# Patient Record
Sex: Female | Born: 1950 | Race: Black or African American | Hispanic: No | Marital: Married | Smoking: Never smoker
Health system: Southern US, Community
[De-identification: ages and names within clinical notes are randomized; demographics above are authoritative.]

## PROBLEM LIST (undated history)

## (undated) DIAGNOSIS — H269 Unspecified cataract: Secondary | ICD-10-CM

## (undated) DIAGNOSIS — I10 Essential (primary) hypertension: Secondary | ICD-10-CM

## (undated) DIAGNOSIS — T7840XA Allergy, unspecified, initial encounter: Secondary | ICD-10-CM

## (undated) HISTORY — PX: COLONOSCOPY: SHX174

## (undated) HISTORY — DX: Unspecified cataract: H26.9

## (undated) HISTORY — DX: Essential (primary) hypertension: I10

## (undated) HISTORY — DX: Allergy, unspecified, initial encounter: T78.40XA

## (undated) HISTORY — PX: PARTIAL HYSTERECTOMY: SHX80

## (undated) HISTORY — PX: HERNIA REPAIR: SHX51

---

## 1999-04-04 ENCOUNTER — Other Ambulatory Visit: Admission: RE | Admit: 1999-04-04 | Discharge: 1999-04-04 | Payer: Self-pay | Admitting: Obstetrics

## 1999-04-04 ENCOUNTER — Encounter (INDEPENDENT_AMBULATORY_CARE_PROVIDER_SITE_OTHER): Payer: Self-pay

## 1999-04-08 ENCOUNTER — Encounter: Payer: Self-pay | Admitting: Obstetrics

## 1999-04-08 ENCOUNTER — Ambulatory Visit (HOSPITAL_COMMUNITY): Admission: RE | Admit: 1999-04-08 | Discharge: 1999-04-08 | Payer: Self-pay | Admitting: Obstetrics

## 1999-05-03 ENCOUNTER — Ambulatory Visit (HOSPITAL_COMMUNITY): Admission: RE | Admit: 1999-05-03 | Discharge: 1999-05-03 | Payer: Self-pay | Admitting: Obstetrics

## 1999-05-03 ENCOUNTER — Encounter (INDEPENDENT_AMBULATORY_CARE_PROVIDER_SITE_OTHER): Payer: Self-pay | Admitting: Specialist

## 2001-01-25 ENCOUNTER — Emergency Department (HOSPITAL_COMMUNITY): Admission: EM | Admit: 2001-01-25 | Discharge: 2001-01-25 | Payer: Self-pay

## 2002-02-26 ENCOUNTER — Encounter (INDEPENDENT_AMBULATORY_CARE_PROVIDER_SITE_OTHER): Payer: Self-pay

## 2002-02-26 ENCOUNTER — Inpatient Hospital Stay (HOSPITAL_COMMUNITY): Admission: RE | Admit: 2002-02-26 | Discharge: 2002-03-01 | Payer: Self-pay | Admitting: Obstetrics

## 2003-12-21 ENCOUNTER — Emergency Department (HOSPITAL_COMMUNITY): Admission: EM | Admit: 2003-12-21 | Discharge: 2003-12-21 | Payer: Self-pay | Admitting: Emergency Medicine

## 2004-05-12 ENCOUNTER — Emergency Department (HOSPITAL_COMMUNITY): Admission: EM | Admit: 2004-05-12 | Discharge: 2004-05-12 | Payer: Self-pay | Admitting: Emergency Medicine

## 2004-07-29 ENCOUNTER — Emergency Department (HOSPITAL_COMMUNITY): Admission: EM | Admit: 2004-07-29 | Discharge: 2004-07-29 | Payer: Self-pay | Admitting: Emergency Medicine

## 2004-09-20 ENCOUNTER — Ambulatory Visit: Payer: Self-pay | Admitting: Internal Medicine

## 2004-10-06 ENCOUNTER — Ambulatory Visit: Payer: Self-pay | Admitting: Internal Medicine

## 2004-10-17 ENCOUNTER — Ambulatory Visit: Payer: Self-pay | Admitting: Internal Medicine

## 2006-04-10 ENCOUNTER — Ambulatory Visit: Payer: Self-pay | Admitting: Internal Medicine

## 2006-04-27 ENCOUNTER — Ambulatory Visit: Payer: Self-pay | Admitting: Internal Medicine

## 2006-04-27 ENCOUNTER — Ambulatory Visit (HOSPITAL_COMMUNITY): Admission: RE | Admit: 2006-04-27 | Discharge: 2006-04-27 | Payer: Self-pay | Admitting: Internal Medicine

## 2007-01-16 ENCOUNTER — Emergency Department (HOSPITAL_COMMUNITY): Admission: EM | Admit: 2007-01-16 | Discharge: 2007-01-16 | Payer: Self-pay | Admitting: Emergency Medicine

## 2007-03-19 ENCOUNTER — Ambulatory Visit: Payer: Self-pay | Admitting: Family Medicine

## 2007-03-20 ENCOUNTER — Ambulatory Visit (HOSPITAL_COMMUNITY): Admission: RE | Admit: 2007-03-20 | Discharge: 2007-03-20 | Payer: Self-pay | Admitting: Family Medicine

## 2007-03-20 ENCOUNTER — Ambulatory Visit: Payer: Self-pay | Admitting: *Deleted

## 2007-04-23 ENCOUNTER — Ambulatory Visit: Payer: Self-pay | Admitting: Family Medicine

## 2007-07-08 ENCOUNTER — Ambulatory Visit: Payer: Self-pay | Admitting: Family Medicine

## 2007-07-10 ENCOUNTER — Ambulatory Visit: Payer: Self-pay | Admitting: Family Medicine

## 2007-10-10 ENCOUNTER — Ambulatory Visit: Payer: Self-pay | Admitting: Family Medicine

## 2007-10-17 ENCOUNTER — Ambulatory Visit (HOSPITAL_COMMUNITY): Admission: RE | Admit: 2007-10-17 | Discharge: 2007-10-17 | Payer: Self-pay | Admitting: Family Medicine

## 2008-04-30 ENCOUNTER — Ambulatory Visit: Payer: Self-pay | Admitting: Internal Medicine

## 2008-05-11 ENCOUNTER — Ambulatory Visit: Payer: Self-pay | Admitting: Internal Medicine

## 2008-05-11 ENCOUNTER — Encounter: Payer: Self-pay | Admitting: Internal Medicine

## 2008-05-13 ENCOUNTER — Encounter: Payer: Self-pay | Admitting: Internal Medicine

## 2008-08-06 ENCOUNTER — Ambulatory Visit: Payer: Self-pay | Admitting: Family Medicine

## 2008-09-15 ENCOUNTER — Ambulatory Visit: Payer: Self-pay | Admitting: Family Medicine

## 2008-09-15 LAB — CONVERTED CEMR LAB
Albumin: 4.1 g/dL (ref 3.5–5.2)
Alkaline Phosphatase: 99 units/L (ref 39–117)
BUN: 16 mg/dL (ref 6–23)
CO2: 26 meq/L (ref 19–32)
Calcium: 9.3 mg/dL (ref 8.4–10.5)
Chloride: 106 meq/L (ref 96–112)
Cholesterol: 213 mg/dL — ABNORMAL HIGH (ref 0–200)
Eosinophils Absolute: 0.3 10*3/uL (ref 0.0–0.7)
Glucose, Bld: 96 mg/dL (ref 70–99)
HDL: 40 mg/dL (ref >39)
Lymphocytes Relative: 44 % (ref 12–46)
Lymphs Abs: 3.7 10*3/uL (ref 0.7–4.0)
MCV: 88.5 fL (ref 78.0–100.0)
Monocytes Relative: 10 % (ref 3–12)
Neutrophils Relative %: 42 % — ABNORMAL LOW (ref 43–77)
Potassium: 4 meq/L (ref 3.5–5.3)
RBC: 5.05 M/uL (ref 3.87–5.11)
Sodium: 143 meq/L (ref 135–145)
Total Protein: 7.5 g/dL (ref 6.0–8.3)
Triglycerides: 227 mg/dL — ABNORMAL HIGH (ref <150)
Vit D, 1,25-Dihydroxy: 21 — ABNORMAL LOW (ref 30–89)
WBC: 8.5 10*3/uL (ref 4.0–10.5)

## 2009-01-25 ENCOUNTER — Ambulatory Visit: Payer: Self-pay | Admitting: Internal Medicine

## 2009-03-29 ENCOUNTER — Ambulatory Visit: Payer: Self-pay | Admitting: Family Medicine

## 2009-06-29 ENCOUNTER — Ambulatory Visit: Payer: Self-pay | Admitting: Family Medicine

## 2009-06-29 LAB — CONVERTED CEMR LAB
CO2: 24 meq/L (ref 19–32)
Chloride: 101 meq/L (ref 96–112)
Creatinine, Ser: 0.88 mg/dL (ref 0.40–1.20)
HDL: 39 mg/dL — ABNORMAL LOW (ref >39)
LDL Cholesterol: 123 mg/dL — ABNORMAL HIGH (ref 0–99)
Potassium: 3.6 meq/L (ref 3.5–5.3)
Sodium: 141 meq/L (ref 135–145)
Total CHOL/HDL Ratio: 5
Triglycerides: 171 mg/dL — ABNORMAL HIGH (ref <150)
Vit D, 25-Hydroxy: 26 ng/mL — ABNORMAL LOW (ref 30–89)

## 2010-03-03 ENCOUNTER — Ambulatory Visit: Payer: Self-pay | Admitting: Family Medicine

## 2010-03-03 LAB — CONVERTED CEMR LAB
ALT: 17 units/L (ref 0–35)
AST: 27 units/L (ref 0–37)
BUN: 16 mg/dL (ref 6–23)
Calcium: 8.8 mg/dL (ref 8.4–10.5)
Chloride: 103 meq/L (ref 96–112)
Creatinine, Ser: 0.92 mg/dL (ref 0.40–1.20)
TSH: 4.138 microintl units/mL (ref 0.350–4.500)
Total Bilirubin: 0.5 mg/dL (ref 0.3–1.2)
Vit D, 25-Hydroxy: 45 ng/mL (ref 30–89)

## 2011-02-03 NOTE — Op Note (Signed)
[facility]  Patient:    Beth Stone, Beth Stone Visit Number: [id number] MRN: [id number]          Service Type: GYN Location: 9300 [id number] Attending Physician:  Venita Sheffield Dictated by:   Kathreen Cosier, M.D. Proc. Date: 02/26/02 Admit Date:  02/26/2002 Discharge Date: 03/01/2002                             Operative Report  PREOPERATIVE DIAGNOSIS:       Myoma uteri.  POSTOPERATIVE DIAGNOSIS:  OPERATION:  SURGEON:                      Kathreen Cosier, M.D.  ASSISTANT:                    Titus Dubin. Alwyn Ren, M.D.  ANESTHESIA:  ESTIMATED BLOOD LOSS:  DESCRIPTION OF PROCEDURE:     The patient is placed on the operating table in the supine position. General anesthesia was administered.  The abdomen was prepped and draped and the bladder emptied with a Foley catheter.  A transverse suprapubic incision made and carried down to the rectus fascia. The fascia cleaned and incised the length of the incision.  The rectus muscles retracted laterally.  The peritoneum was incised longitudinally.  She had 18 weeks size myomas and the uterus was delivered.  The right round ligament was grasped with Kelly clamp, cut, and suture ligated with #1 chromic.  The procedure was done in the similar fashion on the other side.  Using the Metzenbaum scissors, bladder flap was developed and the bladder pushed down off the cervix.  The right infundibulopelvic ligament grasped with a Kelly clamp, cut, and suture ligated with #1 chromic.  The procedure was done in a similar fashion on the other side.  The uterine vessels were skeletonized bilaterally, doubly clamped on the right with Heaney clamps, cut, and suture ligated with #1 chromic.  The procedure was done in a similar fashion on the other side.  The specimen consisting of the uterus, tubes, and ovaries were removed at the cervicovaginal junction with Mayo scissors.  Modified Richardson sutures  placed in the angles of the vagina with #1 chromic. Hemostasis was satisfactory.  The operative site was reperitonealized with 2-0 chromic.  Lap and sponge counts were correct.  The abdomen was closed in layers. The peritoneum with continuous suture of 0 chromic, the fascia with continuous suture of 0 Dexon, skin closed with subcuticular stitch of 3-0 plain.  Estimated blood loss 100 to 125 cc.  The patient tolerated the procedure well and was taken to the recovery room in good condition. Dictated by:   Kathreen Cosier, M.D. Attending Physician:  Venita Sheffield DD:  02/26/02 TD:  02/28/02 Job: [id number]

## 2011-02-03 NOTE — Discharge Summary (Signed)
[facility]  Patient:    Beth Stone, Beth Stone Visit Number: [id number] MRN: [id number]          Service Type: GYN Location: 9300 [id number] Attending Physician:  Venita Sheffield Dictated by:   Kathreen Cosier, M.D. Admit Date:  02/26/2002 Discharge Date: 03/01/2002                             Discharge Summary  HISTORY/HOSPITAL COURSE:  The patient is a 60 year old female with large myomas was admitted for TAHBSO. On admission, the hemoglobin was 13.9, platelets 283, white count 7.2.  The PT and PTT were normal.  Sodium 140, potassium 4, chloride 103.  The patient underwent a TAH and BSO. Postoperatively, she did well.  Her hemoglobin, postoperatively, was 12.6. She was discharged on the third postoperative day, ambulatory on a regular diet to see me in four weeks.  DISCHARGE DIAGNOSES:          Status post total abdominal hysterectomy, bilateral salpingo-oophorectomy for myoma uteri. Dictated by:   Kathreen Cosier, M.D. Attending Physician:  Venita Sheffield DD:  03/01/02 TD:  03/03/02 Job: [id number]

## 2011-02-04 ENCOUNTER — Emergency Department (HOSPITAL_COMMUNITY)
Admission: EM | Admit: 2011-02-04 | Discharge: 2011-02-05 | Disposition: A | Discharge status: Home / Self Care | Payer: Medicare Other | Attending: Emergency Medicine | Admitting: Emergency Medicine

## 2011-02-04 DIAGNOSIS — R1012 Left upper quadrant pain: Secondary | ICD-10-CM | POA: Insufficient documentation

## 2011-02-04 DIAGNOSIS — I10 Essential (primary) hypertension: Secondary | ICD-10-CM | POA: Insufficient documentation

## 2011-02-05 ENCOUNTER — Emergency Department (HOSPITAL_COMMUNITY): Payer: Medicare Other

## 2013-05-13 ENCOUNTER — Encounter: Payer: Self-pay | Admitting: Internal Medicine

## 2013-07-09 ENCOUNTER — Other Ambulatory Visit: Payer: Self-pay

## 2013-07-10 ENCOUNTER — Other Ambulatory Visit: Payer: Self-pay | Admitting: *Deleted

## 2013-07-11 ENCOUNTER — Other Ambulatory Visit: Payer: Self-pay | Admitting: *Deleted

## 2013-07-11 MED ORDER — GABAPENTIN 300 MG PO CAPS: 300.0000 mg | ORAL_CAPSULE | Freq: Every day | ORAL | Status: DC

## 2013-07-11 NOTE — Telephone Encounter (Signed)
Called [facility] [address] [phone], ordered Gabapentin 300 mg #30 1 tablet po q hs +2 refills.  Ordered by Dr Ralene Cork.  Left message [phone].

## 2013-09-25 ENCOUNTER — Encounter: Payer: Self-pay | Admitting: Internal Medicine

## 2013-10-27 ENCOUNTER — Ambulatory Visit (AMBULATORY_SURGERY_CENTER): Payer: Self-pay | Admitting: *Deleted

## 2013-10-27 VITALS — Ht 67.0 in | Wt 197.0 lb

## 2013-10-27 DIAGNOSIS — Z86010 Personal history of colonic polyps: Secondary | ICD-10-CM

## 2013-10-27 MED ORDER — MOVIPREP 100 G PO SOLR: ORAL | Status: DC

## 2013-10-27 NOTE — Progress Notes (Signed)
Patient denies any allergies to eggs or soy. Patient denies any problems with anesthesia.  

## 2013-11-03 ENCOUNTER — Telehealth: Payer: Self-pay | Admitting: Internal Medicine

## 2013-11-03 NOTE — Telephone Encounter (Signed)
Spoke to patient and told her there was not an OTC alternative to Moviprep.  Patient understood and stated she would go ahead and get it.

## 2013-11-10 ENCOUNTER — Encounter: Payer: Medicare Other | Admitting: Internal Medicine

## 2013-11-27 ENCOUNTER — Encounter: Payer: Self-pay | Admitting: Internal Medicine

## 2013-11-27 ENCOUNTER — Ambulatory Visit (AMBULATORY_SURGERY_CENTER): Payer: Medicare Other | Admitting: Internal Medicine

## 2013-11-27 VITALS — BP 108/80 | HR 80 | Temp 96.4°F | Resp 17 | Ht 67.0 in | Wt 197.0 lb

## 2013-11-27 DIAGNOSIS — I10 Essential (primary) hypertension: Secondary | ICD-10-CM | POA: Diagnosis not present

## 2013-11-27 DIAGNOSIS — D126 Benign neoplasm of colon, unspecified: Secondary | ICD-10-CM | POA: Diagnosis not present

## 2013-11-27 DIAGNOSIS — Z86010 Personal history of colonic polyps: Secondary | ICD-10-CM | POA: Diagnosis not present

## 2013-11-27 DIAGNOSIS — E669 Obesity, unspecified: Secondary | ICD-10-CM | POA: Diagnosis not present

## 2013-11-27 DIAGNOSIS — Z1211 Encounter for screening for malignant neoplasm of colon: Secondary | ICD-10-CM | POA: Diagnosis not present

## 2013-11-27 MED ORDER — SODIUM CHLORIDE 0.9 % IV SOLN: 500.0000 mL | INTRAVENOUS | Status: DC

## 2013-11-27 NOTE — Progress Notes (Signed)
Called to room to assist during endoscopic procedure.  Patient ID and intended procedure confirmed with present staff. Received instructions for my participation in the procedure from the performing physician.  

## 2013-11-27 NOTE — Progress Notes (Signed)
Procedure ends, to recovery, report given and VSS. 

## 2013-11-27 NOTE — Op Note (Signed)
[place]  [address] [place], [address]   COLONOSCOPY PROCEDURE REPORT  PATIENT: Beth Stone, Beth Stone.  MR#: [id number] BIRTHDATE: [birth year] , 62  yrs. old GENDER: Female ENDOSCOPIST: Eustace Quail, MD REFERRED [id number] Program Recall PROCEDURE DATE:  11/27/2013 PROCEDURE:   Colonoscopy with snare polypectomy x 3 First Screening Colonoscopy - Avg.  risk and is 50 yrs.  old or older - No.  Prior Negative Screening - Now for repeat screening. N/A  History of Adenoma - Now for follow-up colonoscopy & has been > or = to 3 yrs.  Yes hx of adenoma.  Has been 3 or more years since last colonoscopy.  Polyps Removed Today? Yes. ASA CLASS:   Class II INDICATIONS:Patient's personal history of adenomatous colon polyps. Index 2006 (small TA); 2009 (small TA) MEDICATIONS: MAC sedation, administered by CRNA and propofol (Diprivan) 200mg  IV  DESCRIPTION OF PROCEDURE:   After the risks benefits and alternatives of the procedure were thoroughly explained, informed consent was obtained.  A digital rectal exam revealed no abnormalities of the rectum.   The LB [id number]  endoscope was introduced through the anus and advanced to the cecum, which was identified by both the appendix and ileocecal valve. No adverse events experienced.   The quality of the prep was excellent, using MoviPrep  The instrument was then slowly withdrawn as the colon was fully examined.  COLON FINDINGS: Three diminutive polyps were found in the transverse and descending colon.  A polypectomy was performed with a cold snare.  The resection was complete and the polyp tissue was completely retrieved.   Moderate diverticulosis was noted in the ascending  and sigmoid colon.   The colon mucosa was otherwise normal.  Retroflexed views revealed internal hemorrhoids. The time to cecum=2 minutes 57 seconds.  Withdrawal time=2 minutes 57 seconds.  The scope was withdrawn and the procedure  completed. COMPLICATIONS: There were no complications.  ENDOSCOPIC IMPRESSION: 1.   Three diminutive polyps were found in the colon; polypectomy was performed with a cold snare 2.   Moderate diverticulosis was noted in the ascending colon and sigmoid colon 3.   The colon mucosa was otherwise normal  RECOMMENDATIONS: 1. Follow up colonoscopy in 5 years   eSigned:  Eustace Quail, MD 11/27/2013 10:04 AM   cc: The Patient and Fernande Boyden MD    ([place])

## 2013-11-27 NOTE — Patient Instructions (Signed)

## 2013-11-28 ENCOUNTER — Telehealth: Payer: Self-pay | Admitting: *Deleted

## 2013-11-28 NOTE — Telephone Encounter (Signed)
Left message that we called for f/u 

## 2013-12-02 ENCOUNTER — Encounter: Payer: Self-pay | Admitting: Internal Medicine

## 2013-12-29 DIAGNOSIS — G589 Mononeuropathy, unspecified: Secondary | ICD-10-CM | POA: Diagnosis not present

## 2013-12-29 DIAGNOSIS — I10 Essential (primary) hypertension: Secondary | ICD-10-CM | POA: Diagnosis not present

## 2013-12-29 DIAGNOSIS — M129 Arthropathy, unspecified: Secondary | ICD-10-CM | POA: Diagnosis not present

## 2014-01-02 DIAGNOSIS — R002 Palpitations: Secondary | ICD-10-CM | POA: Diagnosis not present

## 2014-01-02 DIAGNOSIS — R51 Headache: Secondary | ICD-10-CM | POA: Diagnosis not present

## 2014-02-10 ENCOUNTER — Other Ambulatory Visit: Payer: Self-pay

## 2014-02-10 ENCOUNTER — Ambulatory Visit (INDEPENDENT_AMBULATORY_CARE_PROVIDER_SITE_OTHER): Payer: Medicare Other

## 2014-02-10 VITALS — BP 132/98 | HR 75 | Resp 16

## 2014-02-10 DIAGNOSIS — IMO0002 Reserved for concepts with insufficient information to code with codable children: Secondary | ICD-10-CM | POA: Diagnosis not present

## 2014-02-10 DIAGNOSIS — G609 Hereditary and idiopathic neuropathy, unspecified: Secondary | ICD-10-CM

## 2014-02-10 DIAGNOSIS — G629 Polyneuropathy, unspecified: Secondary | ICD-10-CM

## 2014-02-10 DIAGNOSIS — M792 Neuralgia and neuritis, unspecified: Secondary | ICD-10-CM

## 2014-02-10 MED ORDER — GABAPENTIN 300 MG PO CAPS: ORAL_CAPSULE | ORAL | Status: DC

## 2014-02-10 NOTE — Patient Instructions (Signed)

## 2014-02-10 NOTE — Progress Notes (Signed)
   Subjective:    Patient ID: Beth Stone, female    DOB: 11-Jul-1951, 63 y.o.   MRN: [id number]  HPI  Pt reports tingling has had no tingling in feet since she stopped her BP medication. Reports no pain or tingling sensation in feet currently  Review of Systems     Objective:   Physical Exam Neurovascular status is intact pedal pulses are palpable epicritic and proprioceptive sensations intact and symmetric patient case last 2 months since she stopped taking her fluid pill are hydrochlorothiazide she is also lost any the tingling [name] sensations repair paresthesias the patient had previously been experiencing he still taking gabapentin however we'll try discontinue gabapentin for a period of time and may maintain discontinued if symptoms do not recur or if symptoms recur read issued every prescription new prescription with 5 refills for continues to gabapentin  300 mg each bedtime followup in 6 months for an as-needed basis if she continues any problems or difficulties or need any further treatment.       Assessment & Plan:  Assessment is improving resolving peripheral neuropathy symptomology patient is no longer having the paresthesias made the trial discontinue gabapentin patient is no longer taking hydrochlorothiazide and in managing her blood pressure in a different medication continue to follow future as needed we'll get copy of her labs done recently by Dr. Joneen Caraway with [facility]. Recheck in 6 months as needed  Harriet Masson DPM

## 2014-02-11 ENCOUNTER — Other Ambulatory Visit: Payer: Self-pay

## 2014-03-30 DIAGNOSIS — Z1231 Encounter for screening mammogram for malignant neoplasm of breast: Secondary | ICD-10-CM | POA: Diagnosis not present

## 2014-03-30 DIAGNOSIS — Z780 Asymptomatic menopausal state: Secondary | ICD-10-CM | POA: Diagnosis not present

## 2014-06-08 ENCOUNTER — Encounter: Payer: Self-pay | Admitting: Internal Medicine

## 2014-07-13 DIAGNOSIS — I10 Essential (primary) hypertension: Secondary | ICD-10-CM | POA: Diagnosis not present

## 2015-02-17 DIAGNOSIS — Z0000 Encounter for general adult medical examination without abnormal findings: Secondary | ICD-10-CM | POA: Diagnosis not present

## 2015-02-17 DIAGNOSIS — E663 Overweight: Secondary | ICD-10-CM | POA: Diagnosis not present

## 2015-02-17 DIAGNOSIS — Z13220 Encounter for screening for lipoid disorders: Secondary | ICD-10-CM | POA: Diagnosis not present

## 2015-04-05 DIAGNOSIS — Z1231 Encounter for screening mammogram for malignant neoplasm of breast: Secondary | ICD-10-CM | POA: Diagnosis not present

## 2015-12-03 ENCOUNTER — Emergency Department (INDEPENDENT_AMBULATORY_CARE_PROVIDER_SITE_OTHER): Payer: Medicare Other

## 2015-12-03 ENCOUNTER — Emergency Department (HOSPITAL_COMMUNITY): Payer: Medicare Other

## 2015-12-03 ENCOUNTER — Encounter (HOSPITAL_COMMUNITY): Payer: Self-pay | Admitting: *Deleted

## 2015-12-03 ENCOUNTER — Emergency Department (INDEPENDENT_AMBULATORY_CARE_PROVIDER_SITE_OTHER)
Admission: EM | Admit: 2015-12-03 | Discharge: 2015-12-03 | Disposition: A | Discharge status: Home / Self Care | Payer: Medicare Other | Source: Home / Self Care | Attending: Family Medicine | Admitting: Family Medicine

## 2015-12-03 DIAGNOSIS — R0781 Pleurodynia: Secondary | ICD-10-CM

## 2015-12-03 DIAGNOSIS — R2232 Localized swelling, mass and lump, left upper limb: Secondary | ICD-10-CM | POA: Diagnosis not present

## 2015-12-03 NOTE — ED Notes (Signed)
Pt  Noticed  A  Mass  On l  Upper chest    That  She  Noticed        Today         No  Pain   Sitting  Upright  On the  Exam table  In no  Acute   Distress

## 2015-12-03 NOTE — ED Provider Notes (Signed)
CSN: [id number]     Arrival date & time 12/03/15  1338 History   First MD Initiated Contact with Patient 12/03/15 1536     Chief Complaint  Patient presents with  . Mass   (Consider location/radiation/quality/duration/timing/severity/associated sxs/prior Treatment) Patient is a 65 y.o. female presenting with rash. The history is provided by the patient.  Rash Location:  Torso Torso rash location:  L chest Quality: swelling   Severity:  Mild Onset quality:  Sudden Duration:  1 day Progression:  Unchanged Chronicity:  New Context comment:  Just noticed by pt this am when getting dressed, nontender.   Past Medical History  Diagnosis Date  . Hypertension    Past Surgical History  Procedure Laterality Date  . Hernia repair      x2  . Partial hysterectomy     Family History  Problem Relation Age of Onset  . Colon cancer Neg Hx    Social History  Substance Use Topics  . Smoking status: Never Smoker   . Smokeless tobacco: Never Used  . Alcohol Use: No   OB History    No data available     Review of Systems  Constitutional: Negative.   Respiratory: Negative.   Cardiovascular: Negative.   Musculoskeletal: Negative.   Skin: Positive for rash.  All other systems reviewed and are negative.   Allergies  Penicillins  Home Medications   Prior to Admission medications   Medication Sig Start Date End Date Taking? Authorizing Provider  aspirin 81 MG tablet Take 81 mg by mouth daily.    Historical Provider, MD  gabapentin (NEURONTIN) 300 MG capsule TAKE 1 CAPSULE AT BEDTIME 02/10/14   Harriet Masson, DPM  gabapentin (NEURONTIN) 300 MG capsule TAKE 1 CAPSULE AT BEDTIME 02/11/14   Harriet Masson, DPM  hydrochlorothiazide (MICROZIDE) 12.5 MG capsule Take 12.5 mg by mouth daily.    Historical Provider, MD   Meds Ordered and Administered this Visit  Medications - No data to display  BP 143/91 mmHg  Pulse 71  Temp(Src) 98.1 F (36.7 C) (Oral)  Resp 16  SpO2 97% No data  found.   Physical Exam  Constitutional: She is oriented to person, place, and time. She appears well-developed and well-nourished. No distress.  Musculoskeletal: Normal range of motion.  Neurological: She is alert and oriented to person, place, and time.  Skin: Skin is warm and dry.     Nursing note and vitals reviewed.   ED Course  Procedures (including critical care time)  Labs Review Labs Reviewed - No data to display  Imaging Review Dg Clavicle Left  12/03/2015  CLINICAL DATA:  Soft tissue prominence along medial left clavicle EXAM: LEFT CLAVICLE - 2+ VIEWS COMPARISON:  None. FINDINGS: Frontal and tilt frontal images were obtained. Of fracture or dislocation. Joint spaces appear intact. No abnormal periosteal reaction. There is focal soft tissue fullness superior to the midportion left clavicle on the frontal view. No soft tissue calcification or air is seen in this area. This finding is not confirmed on the tilt frontal image. IMPRESSION: Soft tissue fullness on the frontal view superior to the left clavicle midportion, seen on the frontal view but not confirmed on the tilt frontal image. Significance of this soft tissue fullness is uncertain. No bony abnormality is appreciable. If there remains concern for pathology in the supraclavicular region, CT with intravenous contrast could be helpful to further assess. Electronically Signed   By: Lowella Grip III M.D.   On: 12/03/2015 16:20  Visual Acuity Review  Right Eye Distance:   Left Eye Distance:   Bilateral Distance:    Right Eye Near:   Left Eye Near:    Bilateral Near:         MDM   1. Rib pain on left side        Billy Fischer, MD 12/03/15 1648

## 2015-12-03 NOTE — Discharge Instructions (Signed)
Use heat on rib area if needed and see specialist list if further concerns.

## 2015-12-08 DIAGNOSIS — R599 Enlarged lymph nodes, unspecified: Secondary | ICD-10-CM | POA: Diagnosis not present

## 2015-12-08 DIAGNOSIS — E049 Nontoxic goiter, unspecified: Secondary | ICD-10-CM | POA: Diagnosis not present

## 2015-12-16 DIAGNOSIS — R599 Enlarged lymph nodes, unspecified: Secondary | ICD-10-CM | POA: Diagnosis not present

## 2016-01-10 DIAGNOSIS — R221 Localized swelling, mass and lump, neck: Secondary | ICD-10-CM | POA: Diagnosis not present

## 2016-01-10 DIAGNOSIS — E042 Nontoxic multinodular goiter: Secondary | ICD-10-CM | POA: Diagnosis not present

## 2016-02-02 DIAGNOSIS — R591 Generalized enlarged lymph nodes: Secondary | ICD-10-CM | POA: Diagnosis not present

## 2016-02-21 DIAGNOSIS — R591 Generalized enlarged lymph nodes: Secondary | ICD-10-CM | POA: Diagnosis not present

## 2016-04-05 DIAGNOSIS — Z1231 Encounter for screening mammogram for malignant neoplasm of breast: Secondary | ICD-10-CM | POA: Diagnosis not present

## 2016-04-06 DIAGNOSIS — R928 Other abnormal and inconclusive findings on diagnostic imaging of breast: Secondary | ICD-10-CM | POA: Diagnosis not present

## 2016-04-10 DIAGNOSIS — R928 Other abnormal and inconclusive findings on diagnostic imaging of breast: Secondary | ICD-10-CM | POA: Diagnosis not present

## 2016-04-12 DIAGNOSIS — Z1231 Encounter for screening mammogram for malignant neoplasm of breast: Secondary | ICD-10-CM | POA: Diagnosis not present

## 2016-05-05 DIAGNOSIS — E042 Nontoxic multinodular goiter: Secondary | ICD-10-CM | POA: Diagnosis not present

## 2016-05-05 DIAGNOSIS — L659 Nonscarring hair loss, unspecified: Secondary | ICD-10-CM | POA: Diagnosis not present

## 2016-05-05 DIAGNOSIS — Z136 Encounter for screening for cardiovascular disorders: Secondary | ICD-10-CM | POA: Diagnosis not present

## 2016-05-05 DIAGNOSIS — Z13220 Encounter for screening for lipoid disorders: Secondary | ICD-10-CM | POA: Diagnosis not present

## 2016-05-05 DIAGNOSIS — Z0001 Encounter for general adult medical examination with abnormal findings: Secondary | ICD-10-CM | POA: Diagnosis not present

## 2016-05-08 DIAGNOSIS — M8589 Other specified disorders of bone density and structure, multiple sites: Secondary | ICD-10-CM | POA: Diagnosis not present

## 2016-07-07 ENCOUNTER — Encounter: Payer: Self-pay | Admitting: Podiatry

## 2016-07-07 ENCOUNTER — Ambulatory Visit (INDEPENDENT_AMBULATORY_CARE_PROVIDER_SITE_OTHER): Payer: Medicare Other | Admitting: Podiatry

## 2016-07-07 VITALS — BP 140/105 | HR 72 | Resp 14

## 2016-07-07 DIAGNOSIS — L600 Ingrowing nail: Secondary | ICD-10-CM

## 2016-07-07 NOTE — Progress Notes (Signed)
   Subjective:    Patient ID: Beth Stone, female    DOB: 03-15-1951, 65 y.o.   MRN: [id number]  HPI  65 year old female presents the office today for concerns of her right big toenail on the medial border becoming more painful over the last few weeks almost been ongoing for several years. She denies any redness or drainage from the nail site but is painful with pressure in shoe gear. She tried trimming her toenails were any 70 relief. No other complaints.   Review of Systems  All other systems reviewed and are negative.      Objective:   Physical Exam General: AAO x3, NAD  Dermatological: There is incurvation along the medial nail border left hallux toenail tenderness palpation. There is minimal localized edema and erythema or increased warmth. No drainage or pus expressed. No ascending cellulitis. No fluctuance or crepitus or any malodor.  Vascular: Dorsalis Pedis artery and Posterior Tibial artery pedal pulses are 2/4 bilateral with immedate capillary fill time. There is no pain with calf compression, swelling, warmth, erythema.   Neruologic: Grossly intact via light touch bilateral. Vibratory intact via tuning fork bilateral. Protective threshold with Semmes Wienstein monofilament intact to all pedal sites bilateral.   Musculoskeletal: No gross boney pedal deformities bilateral. No pain, crepitus, or limitation noted with foot and ankle range of motion bilateral. Muscular strength 5/5 in all groups tested bilateral.  Gait: Unassisted, Nonantalgic.     Assessment & Plan:  65 year old female left medial nail border ingrown toenail -Treatment options discussed including all alternatives, risks, and complications -Etiology of symptoms were discussed -At this time, the patient is requesting partial nail removal with chemical matricectomy to the symptomatic portion of the nail. Risks and complications were discussed with the patient for which they understand and  verbally consent  to the procedure. Under sterile conditions a total of 3 mL of a mixture of 2% lidocaine plain and 0.5% Marcaine plain was infiltrated in a hallux block fashion. Once anesthetized, the skin was prepped in sterile fashion. A tourniquet was then applied. Next the medial aspect of hallux nail border was then sharply excised making sure to remove the entire offending nail border. Once the nails were ensured to be removed area was debrided and the underlying skin was intact. There is no purulence identified in the procedure. Next phenol was then applied under standard conditions and copiously irrigated. Silvadene was applied. A dry sterile dressing was applied. After application of the dressing the tourniquet was removed and there is found to be an immediate capillary refill time to the digit. The patient tolerated the procedure well any complications. Post procedure instructions were discussed the patient for which he verbally understood. Follow-up in one week for nail check or sooner if any problems are to arise. Discussed signs/symptoms of infection and directed to call the office immediately should any occur or go directly to the emergency room. In the meantime, encouraged to call the office with any questions, concerns, changes symptoms.  Celesta Gentile, DPM

## 2016-07-13 DIAGNOSIS — L600 Ingrowing nail: Secondary | ICD-10-CM | POA: Insufficient documentation

## 2016-07-13 NOTE — Patient Instructions (Signed)

## 2016-07-21 ENCOUNTER — Ambulatory Visit (INDEPENDENT_AMBULATORY_CARE_PROVIDER_SITE_OTHER): Payer: Medicare Other | Admitting: Podiatry

## 2016-07-21 ENCOUNTER — Encounter: Payer: Self-pay | Admitting: Podiatry

## 2016-07-21 DIAGNOSIS — L600 Ingrowing nail: Secondary | ICD-10-CM

## 2016-07-21 DIAGNOSIS — Z9889 Other specified postprocedural states: Secondary | ICD-10-CM

## 2016-07-21 NOTE — Patient Instructions (Signed)

## 2016-07-23 NOTE — Progress Notes (Signed)
Subjective: Beth Stone is a 65 y.o.  female returns to office today for follow up evaluation after having left medial  Hallux partial nail avulsion performed. Patient has been soaking using epsom salts and applying topical antibiotic covered with bandaid daily. No redness, drainage and she states it is "feeling good". Patient denies fevers, chills, nausea, vomiting. Denies any calf pain, chest pain, SOB.   Objective:  Vitals: Reviewed  General: Well developed, nourished, in no acute distress, alert and oriented x3   Dermatology: Skin is warm, dry and supple bilateral. Left hallux nail border appears to be clean, dry, with mild granular tissue and surrounding scab. There is no surrounding erythema, edema, drainage/purulence. The remaining nails appear unremarkable at this time. There are no other lesions or other signs of infection present.  Neurovascular status: Intact. No lower extremity swelling; No pain with calf compression bilateral.  Musculoskeletal: Decreased tenderness to palpation of the medial hallux nail fold. Muscular strength within normal limits bilateral.   Assesement and Plan: S/p partial nail avulsion, doing well.   -Continue soaking in epsom salts twice a day followed by antibiotic ointment and a band-aid. Can leave uncovered at night. Continue this until completely healed.  -If the area has not healed in 2 weeks, call the office for follow-up appointment, or sooner if any problems arise.  -Monitor for any signs/symptoms of infection. Call the office immediately if any occur or go directly to the emergency room. Call with any questions/concerns.  Celesta Gentile, DPM

## 2016-08-26 IMAGING — DX DG CLAVICLE*L*
2 series · 2 of 2 positions shown · non-contrast
Comparison: None.

CLINICAL DATA: Soft tissue prominence along medial left clavicle

EXAM:
LEFT CLAVICLE - 2+ VIEWS

[clavicle ap]
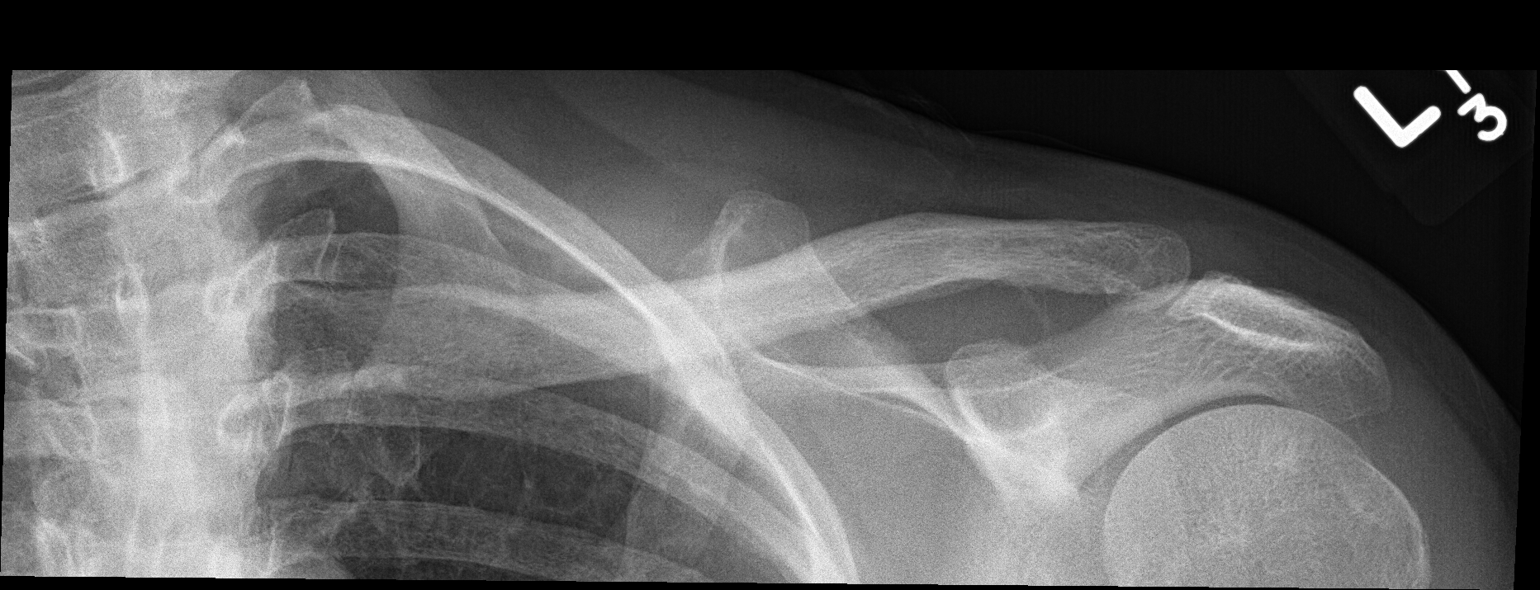

[clavicle axial]
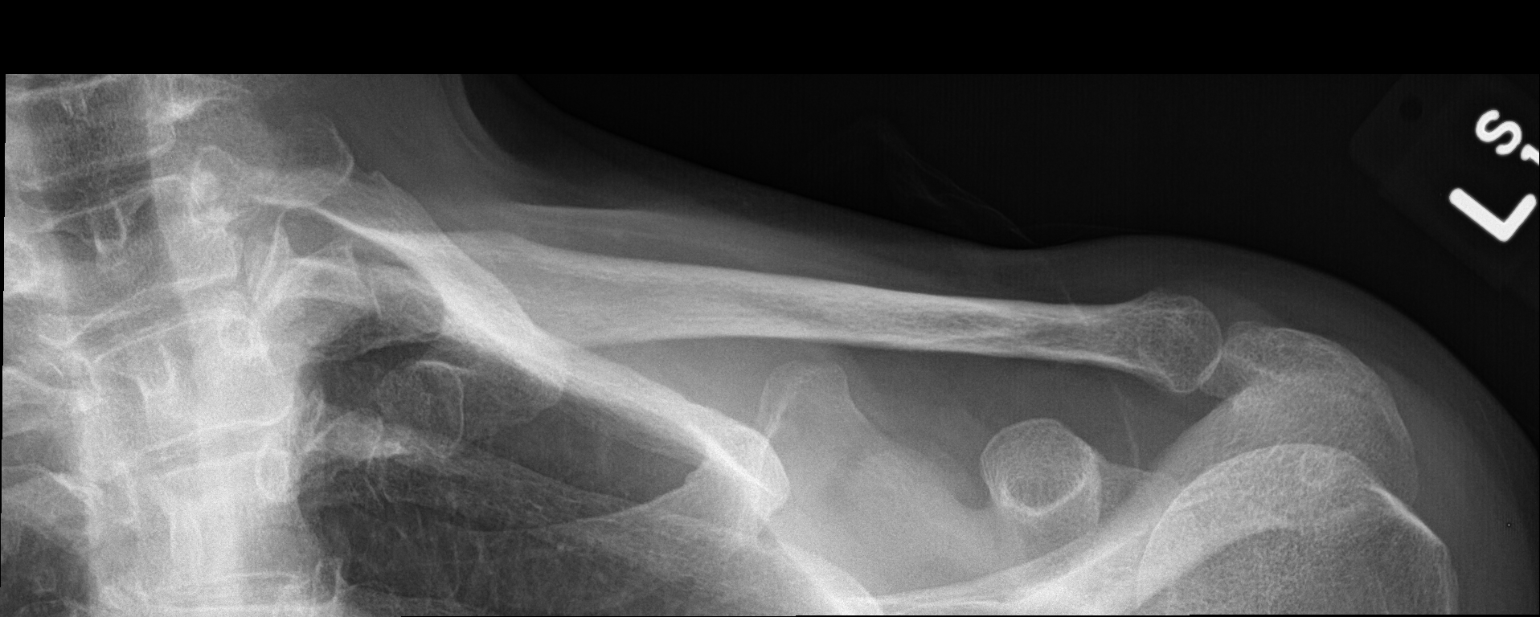

[2 of 2 positions shown; findings below may reference images not displayed]

FINDINGS: Frontal and tilt frontal images were obtained. Of fracture or
dislocation. Joint spaces appear intact. No abnormal periosteal
reaction. There is focal soft tissue fullness superior to the
midportion left clavicle on the frontal view. No soft tissue
calcification or air is seen in this area. This finding is not
confirmed on the tilt frontal image.
IMPRESSION: Soft tissue fullness on the frontal view superior to the left
clavicle midportion, seen on the frontal view but not confirmed on
the tilt frontal image. Significance of this soft tissue fullness is
uncertain. No bony abnormality is appreciable.

If there remains concern for pathology in the supraclavicular
region, CT with intravenous contrast could be helpful to further
assess.

## 2016-08-29 DIAGNOSIS — Z1159 Encounter for screening for other viral diseases: Secondary | ICD-10-CM | POA: Diagnosis not present

## 2016-08-29 DIAGNOSIS — I10 Essential (primary) hypertension: Secondary | ICD-10-CM | POA: Diagnosis not present

## 2016-09-27 DIAGNOSIS — J309 Allergic rhinitis, unspecified: Secondary | ICD-10-CM | POA: Diagnosis not present

## 2016-09-27 DIAGNOSIS — Z87898 Personal history of other specified conditions: Secondary | ICD-10-CM | POA: Diagnosis not present

## 2016-09-27 DIAGNOSIS — I10 Essential (primary) hypertension: Secondary | ICD-10-CM | POA: Diagnosis not present

## 2016-09-28 DIAGNOSIS — N6489 Other specified disorders of breast: Secondary | ICD-10-CM | POA: Diagnosis not present

## 2017-03-31 DIAGNOSIS — Z803 Family history of malignant neoplasm of breast: Secondary | ICD-10-CM | POA: Diagnosis not present

## 2017-03-31 DIAGNOSIS — Z1231 Encounter for screening mammogram for malignant neoplasm of breast: Secondary | ICD-10-CM | POA: Diagnosis not present

## 2017-04-05 DIAGNOSIS — R208 Other disturbances of skin sensation: Secondary | ICD-10-CM | POA: Diagnosis not present

## 2017-04-05 DIAGNOSIS — L659 Nonscarring hair loss, unspecified: Secondary | ICD-10-CM | POA: Diagnosis not present

## 2017-04-05 DIAGNOSIS — Z0000 Encounter for general adult medical examination without abnormal findings: Secondary | ICD-10-CM | POA: Diagnosis not present

## 2017-04-05 DIAGNOSIS — I10 Essential (primary) hypertension: Secondary | ICD-10-CM | POA: Diagnosis not present

## 2017-04-28 DIAGNOSIS — Z23 Encounter for immunization: Secondary | ICD-10-CM | POA: Diagnosis not present

## 2017-05-09 DIAGNOSIS — I10 Essential (primary) hypertension: Secondary | ICD-10-CM | POA: Diagnosis not present

## 2017-06-08 DIAGNOSIS — I10 Essential (primary) hypertension: Secondary | ICD-10-CM | POA: Diagnosis not present

## 2018-12-05 ENCOUNTER — Encounter: Payer: Self-pay | Admitting: Internal Medicine

## 2019-02-11 ENCOUNTER — Encounter: Payer: Self-pay | Admitting: Internal Medicine

## 2019-02-24 ENCOUNTER — Other Ambulatory Visit: Payer: Self-pay

## 2019-02-24 ENCOUNTER — Ambulatory Visit (AMBULATORY_SURGERY_CENTER): Payer: Self-pay

## 2019-02-24 VITALS — Ht 67.0 in | Wt 176.6 lb

## 2019-02-24 DIAGNOSIS — Z86010 Personal history of colonic polyps: Secondary | ICD-10-CM

## 2019-02-24 MED ORDER — PEG-KCL-NACL-NASULF-NA ASC-C 140 G PO SOLR: 1.0000 | Freq: Once | ORAL | Status: AC

## 2019-02-24 NOTE — Progress Notes (Signed)
Per pt, no allergies to soy or egg products.Pt not taking any weight loss meds or using  O2 at home.  Pt denies sedation problems. Pt refused emmi video.  The PV was done in the office instead of over the phone due to pt came into the office today.

## 2019-03-07 ENCOUNTER — Telehealth: Payer: Self-pay | Admitting: Internal Medicine

## 2019-03-07 NOTE — Telephone Encounter (Signed)

## 2019-03-07 NOTE — Telephone Encounter (Signed)
Patient called and answered no to all questions. °

## 2019-03-10 ENCOUNTER — Encounter: Payer: Self-pay | Admitting: Internal Medicine

## 2019-03-10 ENCOUNTER — Other Ambulatory Visit: Payer: Self-pay

## 2019-03-10 ENCOUNTER — Ambulatory Visit (AMBULATORY_SURGERY_CENTER): Payer: Medicare Other | Admitting: Internal Medicine

## 2019-03-10 VITALS — BP 118/79 | HR 67 | Temp 97.6°F | Resp 11 | Ht 67.0 in | Wt 176.0 lb

## 2019-03-10 DIAGNOSIS — D128 Benign neoplasm of rectum: Secondary | ICD-10-CM | POA: Diagnosis not present

## 2019-03-10 DIAGNOSIS — Z86010 Personal history of colonic polyps: Secondary | ICD-10-CM | POA: Diagnosis not present

## 2019-03-10 DIAGNOSIS — D129 Benign neoplasm of anus and anal canal: Secondary | ICD-10-CM

## 2019-03-10 DIAGNOSIS — D124 Benign neoplasm of descending colon: Secondary | ICD-10-CM | POA: Diagnosis not present

## 2019-03-10 MED ORDER — SODIUM CHLORIDE 0.9 % IV SOLN: 500.0000 mL | Freq: Once | INTRAVENOUS | Status: DC

## 2019-03-10 NOTE — Patient Instructions (Signed)
YOU HAD AN ENDOSCOPIC PROCEDURE TODAY AT [facility]:   Refer to the procedure report that was given to you for any specific questions about what was found during the examination.  If the procedure report does not answer your questions, please call your gastroenterologist to clarify.  If you requested that your care partner not be given the details of your procedure findings, then the procedure report has been included in a sealed envelope for you to review at your convenience later.  YOU SHOULD EXPECT: Some feelings of bloating in the abdomen. Passage of more gas than usual.  Walking can help get rid of the air that was put into your GI tract during the procedure and reduce the bloating. If you had a lower endoscopy (such as a colonoscopy or flexible sigmoidoscopy) you may notice spotting of blood in your stool or on the toilet paper. If you underwent a bowel prep for your procedure, you may not have a normal bowel movement for a few days.  Please Note:  You might notice some irritation and congestion in your nose or some drainage.  This is from the oxygen used during your procedure.  There is no need for concern and it should clear up in a day or so.  SYMPTOMS TO REPORT IMMEDIATELY:   Following lower endoscopy (colonoscopy or flexible sigmoidoscopy):  Excessive amounts of blood in the stool  Significant tenderness or worsening of abdominal pains  Swelling of the abdomen that is new, acute  Fever of 100F or higher  For urgent or emergent issues, a gastroenterologist can be reached at any hour by calling [phone].   DIET:  We do recommend a small meal at first, but then you may proceed to your regular diet.  Drink plenty of fluids but you should avoid alcoholic beverages for 24 hours.  ACTIVITY:  You should plan to take it easy for the rest of today and you should NOT DRIVE or use heavy machinery until tomorrow (because of the sedation medicines used during the test).     FOLLOW UP: Our staff will call the number listed on your records 48-72 hours following your procedure to check on you and address any questions or concerns that you may have regarding the information given to you following your procedure. If we do not reach you, we will leave a message.  We will attempt to reach you two times.  During this call, we will ask if you have developed any symptoms of COVID 19. If you develop any symptoms (ie: fever, flu-like symptoms, shortness of breath, cough etc.) before then, please call [phone].  If you test positive for Covid 19 in the 2 weeks post procedure, please call and report this information to [place].    If any biopsies were taken you will be contacted by phone or by letter within the next 1-3 weeks.  Please call us at [phone] if you have not heard about the biopsies in 3 weeks.   Await for biopsy results Polyps (handout given) Diverticulosis (handout given)  SIGNATURES/CONFIDENTIALITY: You and/or your care partner have signed paperwork which will be entered into your electronic medical record.  These signatures attest to the fact that that the information above on your After Visit Summary has been reviewed and is understood.  Full responsibility of the confidentiality of this discharge information lies with you and/or your care-partner.

## 2019-03-10 NOTE — Progress Notes (Signed)
Report to PACU, RN, vss, BBS= Clear.  

## 2019-03-10 NOTE — Progress Notes (Signed)
Pt's states no medical or surgical changes since previsit or office visit. 

## 2019-03-10 NOTE — Op Note (Signed)
[place] Patient Name: Beth Stone Procedure Date: 03/10/2019 8:01 AM MRN: [id number] Endoscopist: Docia Chuck. Henrene Pastor , MD Age: 68 Referring MD:  Date of Birth: Apr 12, 1951 Gender: Female Account #: [id number] Procedure:                Colonoscopy with cold snare polypectomy x 2 Indications:              High risk colon cancer surveillance: Personal                            history of multiple (3 or more) adenomas. Previous                            examinations 2006, 2009, 2015 Medicines:                Monitored Anesthesia Care Procedure:                Pre-Anesthesia Assessment:                           - Prior to the procedure, a History and Physical                            was performed, and patient medications and                            allergies were reviewed. The patient's tolerance of                            previous anesthesia was also reviewed. The risks                            and benefits of the procedure and the sedation                            options and risks were discussed with the patient.                            All questions were answered, and informed consent                            was obtained. Prior Anticoagulants: The patient has                            taken no previous anticoagulant or antiplatelet                            agents. ASA Grade Assessment: II - A patient with                            mild systemic disease. After reviewing the risks                            and benefits, the patient was deemed in  satisfactory condition to undergo the procedure.                           After obtaining informed consent, the colonoscope                            was passed under direct vision. Throughout the                            procedure, the patient's blood pressure, pulse, and                            oxygen saturations were monitored continuously. The   Model CF-HQ190L [id number]) scope was introduced                            through the anus and advanced to the the cecum,                            identified by appendiceal orifice and ileocecal                            valve. The ileocecal valve, appendiceal orifice,                            and rectum were photographed. The quality of the                            bowel preparation was excellent. The colonoscopy                            was performed without difficulty. The patient                            tolerated the procedure well. The bowel preparation                            used was SUPREP via split dose instruction. Scope In: 8:13:52 AM Scope Out: 8:26:06 AM Scope Withdrawal Time: 0 hours 9 minutes 46 seconds  Total Procedure Duration: 0 hours 12 minutes 14 seconds  Findings:                 Two polyps were found in the rectum and descending                            colon. The polyps were 2 to 3 mm in size. These                            polyps were removed with a cold snare. Resection                            and retrieval were complete.  Multiple diverticula were found in the sigmoid                            colon.                           The exam was otherwise without abnormality on                            direct and retroflexion views. Complications:            No immediate complications. Estimated blood loss:                            None. Estimated Blood Loss:     Estimated blood loss: none. Impression:               - Two 2 to 3 mm polyps in the rectum and in the                            descending colon, removed with a cold snare.                            Resected and retrieved.                           - Diverticulosis in the sigmoid colon.                           - The examination was otherwise normal on direct                            and retroflexion views. Recommendation:           - Repeat  colonoscopy in 5 years for surveillance.                           - Patient has a contact number available for                            emergencies. The signs and symptoms of potential                            delayed complications were discussed with the                            patient. Return to normal activities tomorrow.                            Written discharge instructions were provided to the                            patient.                           - Resume previous diet.                           -  Continue present medications.                           - Await pathology results. Docia Chuck. Henrene Pastor, MD 03/10/2019 8:37:24 AM This report has been signed electronically.

## 2019-03-10 NOTE — Progress Notes (Signed)
[place], [place], CMA- vitals

## 2019-03-12 ENCOUNTER — Telehealth: Payer: Self-pay

## 2019-03-12 ENCOUNTER — Encounter: Payer: Self-pay | Admitting: Internal Medicine

## 2019-03-12 NOTE — Telephone Encounter (Signed)
  Follow up Call-  Call back number 03/10/2019  Post procedure Call Back phone  # [phone]  Permission to leave phone message Yes  Some recent data might be hidden     Patient questions:  Do you have a fever, pain , or abdominal swelling? No. Pain Score  0 *  Have you tolerated food without any problems? Yes.    Have you been able to return to your normal activities? Yes.    Do you have any questions about your discharge instructions: Diet   No. Medications  No. Follow up visit  No.  Do you have questions or concerns about your Care? No.  Actions: * If pain score is 4 or above: 1. No action needed, pain <4.Have you developed a fever since your procedure? no  2.   Have you had an respiratory symptoms (SOB or cough) since your procedure? no  3.   Have you tested positive for COVID 19 since your procedure no  4.   Have you had any family members/close contacts diagnosed with the COVID 19 since your procedure?  no   If yes to any of these questions please route to Joylene John, RN and Alphonsa Gin, Therapist, sports. no

## 2020-03-19 ENCOUNTER — Observation Stay (HOSPITAL_COMMUNITY)
Admission: EM | Admit: 2020-03-19 | Discharge: 2020-03-20 | Disposition: A | Discharge status: Home / Self Care | Payer: Medicare Other | Attending: Internal Medicine | Admitting: Internal Medicine

## 2020-03-19 ENCOUNTER — Encounter (HOSPITAL_COMMUNITY): Payer: Self-pay | Admitting: Emergency Medicine

## 2020-03-19 ENCOUNTER — Other Ambulatory Visit: Payer: Self-pay

## 2020-03-19 DIAGNOSIS — T502X5A Adverse effect of carbonic-anhydrase inhibitors, benzothiadiazides and other diuretics, initial encounter: Secondary | ICD-10-CM | POA: Insufficient documentation

## 2020-03-19 DIAGNOSIS — Z7982 Long term (current) use of aspirin: Secondary | ICD-10-CM | POA: Insufficient documentation

## 2020-03-19 DIAGNOSIS — I10 Essential (primary) hypertension: Secondary | ICD-10-CM | POA: Diagnosis not present

## 2020-03-19 DIAGNOSIS — Z20822 Contact with and (suspected) exposure to covid-19: Secondary | ICD-10-CM | POA: Insufficient documentation

## 2020-03-19 DIAGNOSIS — I440 Atrioventricular block, first degree: Secondary | ICD-10-CM | POA: Insufficient documentation

## 2020-03-19 DIAGNOSIS — R55 Syncope and collapse: Principal | ICD-10-CM | POA: Insufficient documentation

## 2020-03-19 DIAGNOSIS — E876 Hypokalemia: Secondary | ICD-10-CM

## 2020-03-19 DIAGNOSIS — Z79899 Other long term (current) drug therapy: Secondary | ICD-10-CM | POA: Diagnosis not present

## 2020-03-19 LAB — CBC WITH DIFFERENTIAL/PLATELET
Abs Immature Granulocytes: 0.03 10*3/uL (ref 0.00–0.07)
Basophils Absolute: 0.1 10*3/uL (ref 0.0–0.1)
Basophils Relative: 0 %
Eosinophils Absolute: 0.2 10*3/uL (ref 0.0–0.5)
Eosinophils Relative: 2 %
HCT: 42.1 % (ref 36.0–46.0)
Hemoglobin: 13.6 g/dL (ref 12.0–15.0)
Immature Granulocytes: 0 %
Lymphocytes Relative: 52 %
Lymphs Abs: 5.8 10*3/uL — ABNORMAL HIGH (ref 0.7–4.0)
MCH: 29.7 pg (ref 26.0–34.0)
MCHC: 32.3 g/dL (ref 30.0–36.0)
MCV: 91.9 fL (ref 80.0–100.0)
Monocytes Absolute: 1.1 10*3/uL — ABNORMAL HIGH (ref 0.1–1.0)
Monocytes Relative: 9 %
Neutro Abs: 4.3 10*3/uL (ref 1.7–7.7)
Neutrophils Relative %: 37 %
Platelets: 280 10*3/uL (ref 150–400)
RBC: 4.58 MIL/uL (ref 3.87–5.11)
RDW: 12.7 % (ref 11.5–15.5)
WBC: 11.4 10*3/uL — ABNORMAL HIGH (ref 4.0–10.5)
nRBC: 0 % (ref 0.0–0.2)

## 2020-03-19 LAB — COMPREHENSIVE METABOLIC PANEL
ALT: 19 U/L (ref 0–44)
AST: 32 U/L (ref 15–41)
Albumin: 3.5 g/dL (ref 3.5–5.0)
Alkaline Phosphatase: 93 U/L (ref 38–126)
Anion gap: 11 (ref 5–15)
BUN: 18 mg/dL (ref 8–23)
CO2: 26 mmol/L (ref 22–32)
Calcium: 8.6 mg/dL — ABNORMAL LOW (ref 8.9–10.3)
Chloride: 104 mmol/L (ref 98–111)
Creatinine, Ser: 0.97 mg/dL (ref 0.44–1.00)
GFR calc Af Amer: >60 mL/min (ref >60)
GFR calc non Af Amer: >60 mL/min (ref >60)
Glucose, Bld: 121 mg/dL — ABNORMAL HIGH (ref 70–99)
Potassium: 2.5 mmol/L — CL (ref 3.5–5.1)
Sodium: 141 mmol/L (ref 135–145)
Total Bilirubin: 0.6 mg/dL (ref 0.3–1.2)
Total Protein: 6.8 g/dL (ref 6.5–8.1)

## 2020-03-19 LAB — URINALYSIS, ROUTINE W REFLEX MICROSCOPIC
Bacteria, UA: NONE SEEN
Bilirubin Urine: NEGATIVE
Glucose, UA: NEGATIVE mg/dL
Ketones, ur: NEGATIVE mg/dL
Nitrite: NEGATIVE
Protein, ur: NEGATIVE mg/dL
Specific Gravity, Urine: 1.008 (ref 1.005–1.030)
pH: 7 (ref 5.0–8.0)

## 2020-03-19 LAB — CBG MONITORING, ED: Glucose-Capillary: 101 mg/dL — ABNORMAL HIGH (ref 70–99)

## 2020-03-19 LAB — MAGNESIUM: Magnesium: 1.9 mg/dL (ref 1.7–2.4)

## 2020-03-19 LAB — SARS CORONAVIRUS 2 BY RT PCR (HOSPITAL ORDER, PERFORMED IN ~~LOC~~ HOSPITAL LAB): SARS Coronavirus 2: NEGATIVE

## 2020-03-19 MED ORDER — POTASSIUM CHLORIDE 10 MEQ/100ML IV SOLN
10.0000 meq | INTRAVENOUS | Status: AC
  Administered 2020-03-19 – 2020-03-20 (×5): 10 meq via INTRAVENOUS
  Filled 2020-03-19 (×5): qty 100

## 2020-03-19 MED ORDER — ONDANSETRON HCL 4 MG/2ML IJ SOLN: 4.0000 mg | Freq: Four times a day (QID) | INTRAMUSCULAR | Status: DC | PRN

## 2020-03-19 MED ORDER — ACETAMINOPHEN 650 MG RE SUPP: 650.0000 mg | Freq: Four times a day (QID) | RECTAL | Status: DC | PRN

## 2020-03-19 MED ORDER — ONDANSETRON HCL 4 MG PO TABS: 4.0000 mg | ORAL_TABLET | Freq: Four times a day (QID) | ORAL | Status: DC | PRN

## 2020-03-19 MED ORDER — SENNOSIDES-DOCUSATE SODIUM 8.6-50 MG PO TABS: 1.0000 | ORAL_TABLET | Freq: Every evening | ORAL | Status: DC | PRN

## 2020-03-19 MED ORDER — ALUM & MAG HYDROXIDE-SIMETH 200-200-20 MG/5ML PO SUSP: 30.0000 mL | Freq: Two times a day (BID) | ORAL | Status: DC | PRN

## 2020-03-19 MED ORDER — SODIUM CHLORIDE 0.9% FLUSH: 3.0000 mL | Freq: Two times a day (BID) | INTRAVENOUS | Status: DC

## 2020-03-19 MED ORDER — ENOXAPARIN SODIUM 40 MG/0.4ML ~~LOC~~ SOLN
40.0000 mg | SUBCUTANEOUS | Status: DC
  Administered 2020-03-19: 40 mg via SUBCUTANEOUS
  Filled 2020-03-19: qty 0.4

## 2020-03-19 MED ORDER — ONDANSETRON HCL 4 MG/2ML IJ SOLN
4.0000 mg | Freq: Once | INTRAMUSCULAR | Status: AC
  Administered 2020-03-19: 4 mg via INTRAVENOUS
  Filled 2020-03-19: qty 2

## 2020-03-19 MED ORDER — ACETAMINOPHEN 325 MG PO TABS: 650.0000 mg | ORAL_TABLET | Freq: Four times a day (QID) | ORAL | Status: DC | PRN

## 2020-03-19 MED ORDER — SODIUM CHLORIDE 0.9 % IV BOLUS
1000.0000 mL | Freq: Once | INTRAVENOUS | Status: AC
  Administered 2020-03-19: 1000 mL via INTRAVENOUS

## 2020-03-19 NOTE — ED Triage Notes (Signed)
BIB [facility] after syncopal episode with pos. Loss of consciousness while eating dinner with family. Family reported to EMS that pt did not fall down during LOC, but laid head on table and body tension. PT reports feeling weak x 1 day. EMS report + ortho statics 671 systolic while sitting and 245 systolic while laying down. Upon arrival, B/P 139/81, HR 55. PT diaphoretic and emesis x 1.

## 2020-03-19 NOTE — Hospital Course (Addendum)
Admitted 03/19/2020  Allergies: Penicillins Pertinent Hx: HTN controlled with chlorthalidone  69 y.o. female who presented following a syncopal episode with brief loss of consciousness.  Syncope w/ seizure-like semiology: On 7/2, while having dinner with her daughter, patient experienced a self-resolving syncopal episode, lasting ~1 minute, characterized by upward eye deviation, upper extremity flexion, and urinary incontinence. Her mental status immediately returned to baseline upon return of consciousness. In the ED, was found to be hypokalemic (2.5) and orthostatic. Volume status and potassium were treated with NS and Kcl, respectively and patient was admitted to Internal Medicine for observation. Her home chlorthalidone was held due to initial hypovolemic presentation. Overnight, she was without complaints denying further episodes of dizziness, limb contractions, or urinary incontinence. By the morning of 7/3, repeat orthostatic testing was borderline-normal without symptoms, so was given an additional 1L bolus of LR. Afterwards, she was deemed stable for discharge with discontinuation of her home diuretic.   Hypokalemia: Was found in the ED as stated above (level 2.5). EKG was notable for PR elongation consistent with first degree AV block. Received 1 L of NS, 60 mEq of IV potassium, IV Zofran in ED. Upon transfer to Internal Medicine, potassium was further repleted and was placed on cardiac telemetry. By the morning of 7/3, potassium was 3.2 so was given an additional dose of PO potassium. Telemetry was stable throughout admission.

## 2020-03-19 NOTE — H&P (Addendum)
Date: 03/19/2020               Patient Name:  Beth Stone MRN: [id number]  DOB: Mar 19, 1951 Age / Sex: 69 y.o., female   PCP: Powers, Charolotte Eke, MD         Medical Service: Internal Medicine Teaching Service         Attending Physician: Dr. Velna Ochs, MD    First Contact: Dr. Virl Axe, MD Pager: [phone]  Second Contact: Dr. Thomasenia Bottoms, MD Pager: [phone]       After Hours (After 5p/  First Contact Pager: [phone]  weekends / holidays): Second Contact Pager: [phone]   Chief Complaint: loss of consciousness  History of Present Illness: Patient is 69yo F w/ hx of HTN who presents to ED with syncopal episode earlier today. For the past week, patient reports decreased overall energy. She denies any recent changes in her diet or lifestyle. When she woke up this morning, she describes feeling weak and more tired than usual. Daughter and patient went shopping and had lunch at [employer] at approximately 2:00pm. Two hours after the meal, patient was sleepy and took a short nap. Once patient woke up, she felt warm and went to get some water. Per daughter, patient was diaphoretic when she walked to the refrigerator. After grabbing a water bottle, the patient began to fall forward. Daughter was able to catch patient before hitting the floor. The patient's body then tensed up and her eyes rolled back. During episode, patient had incontinence without prior urgency. Daughter reports episode lasted 1 minute and denies any tonic-clonic movement. Upon conclusion, patient was able to say her name and follow commands to EMS operator. Patient denies any prior episode of syncope and reports no family history of seizures or cardiac disease. Denies palpitations, CP, SOB, dizziness, HA, vision changes, fevers. Denies sick contacts, recent travel.  Upon arrival in ED, pt had orthostatic hypotension, nausea, and vomiting. She was given NS bolus, Zofran, and KCl. During interview, patient reports  she is at her baseline (confirmed by daughter). She has tried a few different hypertension medications in the past, but cannot recall their names. She has been taking her current medication for a few years now. Patient confirmed she took her morning dosage today.  Meds: Prior to Admission medications   Medication Sig Start Date End Date Taking? Authorizing Provider  aspirin 81 MG tablet Take 81 mg by mouth daily.    Yes [provider]  chlorthalidone (HYGROTON) 25 MG tablet Take 25 mg by mouth daily.   Yes [provider]  TURMERIC PO Take by mouth as needed. Patient not taking: Reported on 03/19/2020    [provider]    Allergies: Allergies as of 03/19/2020 - Review Complete 03/19/2020  Allergen Reaction Noted  . Penicillins Hives and Rash 06/04/2012   Past Medical History:  Diagnosis Date  . Allergy    seasonal  . Cataract    Bil/ no surgery yet.  Marland Kitchen Hypertension     Family History: Pt denies family hx of cardiac disease, seizures, or cancers.  Social History:  Pt reports that she lives in [place] with husband. Reports she is usually active and often does yard work.  Denies use of tobacco products, alcohol, or recreational drugs.  Review of Systems: A complete ROS was negative except as per HPI.   Physical Exam: Blood pressure 132/80, pulse 61, temperature 97.7 F (36.5 C), temperature source Oral, resp. rate 20, height  5\' 7"  (1.702 m), weight 78.5 kg, SpO2 98 %. Physical Exam Constitutional:      General: She is not in acute distress.    Appearance: She is not ill-appearing or diaphoretic.  HENT:     Head: Normocephalic and atraumatic.  Eyes:     General: No scleral icterus.    Extraocular Movements: Extraocular movements intact.     Conjunctiva/sclera: Conjunctivae normal.     Pupils: Pupils are equal, round, and reactive to light.  Neck:     Vascular: No carotid bruit.  Cardiovascular:     Rate and Rhythm: Normal rate and regular  rhythm.     Heart sounds: No murmur heard.  No friction rub. No gallop.   Pulmonary:     Effort: Pulmonary effort is normal.     Breath sounds: Normal breath sounds. No wheezing, rhonchi or rales.  Abdominal:     General: Abdomen is flat. Bowel sounds are normal. There is no distension.     Palpations: Abdomen is soft.     Tenderness: There is no abdominal tenderness.  Musculoskeletal:        General: No swelling. Normal range of motion.     Right lower leg: No edema.     Left lower leg: No edema.  Skin:    General: Skin is warm and dry.  Neurological:     General: No focal deficit present.     Mental Status: She is alert. Mental status is at baseline.     Cranial Nerves: No cranial nerve deficit.     Sensory: No sensory deficit.     Motor: No weakness.   CMP: Na 141, K 2.5, Cl 104, CO2 26, Glucose 121, BUN 18, Cr 0.97, Ca 8.6, Alb 3.5, AST 32, ALT 19, Mg 1.9  CBC: WBC 11.4, Hgb 13.6, Hct 42.1, Plt 280, MCV 91.9, MCH 29.7, MCHC 32.9, RDW 13.6  UA: pH 7.0, glucose/bilirubin/ketones/protein/nitrite neg, mod leukocytes. No RBC, 11-20 WBC, no bacteria  EKG: personally reviewed my interpretation is sinus bradycardia, first degree AV block.  Assessment & Plan by Problem: Active Problems:   * No active hospital problems. * Patient is 69yo F w/ hx of HTN (on chlorthalidone) who presents w/ syncopal episode, likely due to orthostatic hypotension in setting of metabolic disturbance 2/2 thiazide use. Other etiologies including vasovagal, seizure, cardiac disease.  #Syncope, orthostatic hypotension Upon arrival, pt orthostatic, soft BP; K 2.5, Mg 1.9. Likely pt hypokalemic d/t thiazide. Patient presentation also included urinary incontinence and involuntary contraction, although patient was not post-ictal and has no prior hx of seizures. Holding EEG for now as likely convulsive syncope. ECG also notable for first degree AV block. Patient back to baseline on admission. Plan to observe  overnight.  -Given IV KCl in ED -Holding EEG, discuss need in AM -Cardiac tele  #Hypokalemia likely 2/2 chlorithalidone -K 2.5 on admission, given IV KCl 30mEq in ED -AM BMP> K 3.2. Giving 45mEq PO KDUR  #Hypertension -Soft/orthostatic BP on EMS arrival, remained stable after admission after 1L NS -Holding home chlorthalidone  -Monitoring BP   Diet: HH DVT PPX: Lovenox IVF: Bolus NS Full Code: Full  Dispo: Admit patient to Observation with expected length of stay less than 2 midnights.  Signed: Sanjuan Dame, MD 03/19/2020, 8:49 PM  Pager: @MYPAGER @ After 5pm on weekdays and 1pm on weekends: On Call pager: [phone]

## 2020-03-19 NOTE — ED Provider Notes (Signed)
[facility] EMERGENCY DEPARTMENT Provider Note   CSN: [id number] Arrival date & time: 03/19/20  1715     History Chief Complaint  Patient presents with  . Loss of Consciousness  . Weakness    Beth Stone is a 69 y.o. female.  The history is provided by the patient, the EMS personnel, a relative and medical records. No language interpreter was used.  Loss of Consciousness Associated symptoms: weakness   Weakness Associated symptoms: syncope    Beth Stone is a 69 y.o. female who presents to the Emergency Department complaining of syncope. She presents the emergency department by EMS for evaluation following syncopal event. She woke up from a nap this afternoon feeling unwell. She went to drink some water and sat down at the table. Her daughter states that she then became unresponsive. She leaned back in the chair with her arms drawn up towards her and her eyes rolled back in her head. She is unresponsive for about one minute with gasping respirations. She then regained consciousness and was at her baseline but complained of feeling fatigued. On EMS arrival she was found to be orthostatic. On ED arrival she complained of nausea and began vomiting profusely. She did have [employer] for lunch. She denies any recent illnesses. She complains of no headache, chest pain, abdominal pain. She has a history of hypertension, no additional medical problems. Symptoms are severe and constant nature.    Past Medical History:  Diagnosis Date  . Allergy    seasonal  . Cataract    Bil/ no surgery yet.  Marland Kitchen Hypertension     Patient Active Problem List   Diagnosis Date Noted  . Ingrown toenail 07/13/2016    Past Surgical History:  Procedure Laterality Date  . COLONOSCOPY    . HERNIA REPAIR     x2/inguinal  . PARTIAL HYSTERECTOMY     was complete hysterectomy per pt     OB History   No obstetric history on file.     Family History  Problem Relation Age of  Onset  . Healthy Sister   . Colon cancer Neg Hx     Social History   Tobacco Use  . Smoking status: Never Smoker  . Smokeless tobacco: Never Used  Vaping Use  . Vaping Use: Never used  Substance Use Topics  . Alcohol use: No  . Drug use: No    Home Medications Prior to Admission medications   Medication Sig Start Date End Date Taking? Authorizing Provider  aspirin 81 MG tablet Take 81 mg by mouth as needed.     [provider]  chlorthalidone (HYGROTON) 25 MG tablet Take 25 mg by mouth daily.    [provider]  TURMERIC PO Take by mouth as needed.    [provider]    Allergies    Penicillins  Review of Systems   Review of Systems  Cardiovascular: Positive for syncope.  Neurological: Positive for weakness.  All other systems reviewed and are negative.   Physical Exam Updated Vital Signs BP 132/80   Pulse 61   Temp 97.7 F (36.5 C) (Oral)   Resp 20   Ht 5\' 7"  (1.702 m)   Wt 78.5 kg   SpO2 98%   BMI 27.10 kg/m   Physical Exam Vitals and nursing note reviewed.  Constitutional:      Appearance: She is well-developed.  HENT:     Head: Normocephalic and atraumatic.  Eyes:  Extraocular Movements: Extraocular movements intact.     Pupils: Pupils are equal, round, and reactive to light.  Cardiovascular:     Rate and Rhythm: Normal rate and regular rhythm.     Heart sounds: No murmur heard.   Pulmonary:     Effort: Pulmonary effort is normal. No respiratory distress.     Breath sounds: Normal breath sounds.  Abdominal:     Palpations: Abdomen is soft.     Tenderness: There is no abdominal tenderness. There is no guarding or rebound.  Musculoskeletal:        General: No tenderness.  Skin:    General: Skin is warm and dry.  Neurological:     Mental Status: She is alert and oriented to person, place, and time.     Comments: No asymmetry of facial movements. Visual fields are grossly intact. Five out of five strength in all  four extremities with sensation light touch intact in all four extremities.  Psychiatric:        Behavior: Behavior normal.     ED Results / Procedures / Treatments   Labs (all labs ordered are listed, but only abnormal results are displayed) Labs Reviewed  COMPREHENSIVE METABOLIC PANEL - Abnormal; Notable for the following components:      Result Value   Potassium 2.5 (*)    Glucose, Bld 121 (*)    Calcium 8.6 (*)    All other components within normal limits  CBC WITH DIFFERENTIAL/PLATELET - Abnormal; Notable for the following components:   WBC 11.4 (*)    Lymphs Abs 5.8 (*)    Monocytes Absolute 1.1 (*)    All other components within normal limits  CBG MONITORING, ED - Abnormal; Notable for the following components:   Glucose-Capillary 101 (*)    All other components within normal limits  SARS CORONAVIRUS 2 BY RT PCR (HOSPITAL ORDER, [place] [facility])  MAGNESIUM  URINALYSIS, ROUTINE W REFLEX MICROSCOPIC    EKG EKG Interpretation  Date/Time:  Friday March 19 2020 17:19:27 EDT Ventricular Rate:  57 PR Interval:    QRS Duration: 106 QT Interval:  467 QTC Calculation: 455 R Axis:   20 Text Interpretation: Sinus rhythm Prolonged PR interval Consider left atrial enlargement Low voltage, precordial leads Borderline T abnormalities, anterior leads Confirmed by Quintella Reichert [phone]) on 03/19/2020 6:02:54 PM   Radiology No results found.  Procedures Procedures (including critical care time)  Medications Ordered in ED Medications  potassium chloride 10 mEq in 100 mL IVPB (10 mEq Intravenous New Bag/Given 03/19/20 2019)  sodium chloride 0.9 % bolus 1,000 mL (0 mLs Intravenous Stopped 03/19/20 1920)  ondansetron (ZOFRAN) injection 4 mg (4 mg Intravenous Given 03/19/20 1739)    ED Course  I have reviewed the triage vital signs and the nursing notes.  Pertinent labs & imaging results that were available during my care of the patient were reviewed by me and  considered in my medical decision making (see chart for details).    MDM Rules/Calculators/A&P                         Patient here for evaluation following syncopal event. Patient developed significant nausea and vomiting upon arrival to the emergency department. EKG without acute ischemic changes. Lab significant for profound hypokalemia. She is on chronic diuretic. Question of hypokalemia contributed to her syncopal event. Plan to repeat potassium. Given her syncope, hypokalemia and ongoing nausea/vomiting recommend observation admission. Medicine consulted  for admission.  Final Clinical Impression(s) / ED Diagnoses Final diagnoses:  Syncope and collapse  Hypokalemia    Rx / DC Orders ED Discharge Orders    None       Quintella Reichert, MD 03/19/20 2029

## 2020-03-19 NOTE — ED Notes (Signed)
Ayesha Rumpf, MD notified of critical Potassium 2.5

## 2020-03-20 ENCOUNTER — Encounter (HOSPITAL_COMMUNITY): Payer: Self-pay | Admitting: Internal Medicine

## 2020-03-20 ENCOUNTER — Other Ambulatory Visit: Payer: Self-pay

## 2020-03-20 ENCOUNTER — Observation Stay (HOSPITAL_BASED_OUTPATIENT_CLINIC_OR_DEPARTMENT_OTHER): Payer: Medicare Other

## 2020-03-20 DIAGNOSIS — E876 Hypokalemia: Secondary | ICD-10-CM

## 2020-03-20 DIAGNOSIS — R55 Syncope and collapse: Secondary | ICD-10-CM

## 2020-03-20 LAB — BASIC METABOLIC PANEL
Anion gap: 8 (ref 5–15)
BUN: 10 mg/dL (ref 8–23)
CO2: 27 mmol/L (ref 22–32)
Calcium: 8.6 mg/dL — ABNORMAL LOW (ref 8.9–10.3)
Chloride: 106 mmol/L (ref 98–111)
Creatinine, Ser: 0.86 mg/dL (ref 0.44–1.00)
GFR calc Af Amer: >60 mL/min (ref >60)
GFR calc non Af Amer: >60 mL/min (ref >60)
Glucose, Bld: 103 mg/dL — ABNORMAL HIGH (ref 70–99)
Potassium: 3.2 mmol/L — ABNORMAL LOW (ref 3.5–5.1)
Sodium: 141 mmol/L (ref 135–145)

## 2020-03-20 LAB — ECHOCARDIOGRAM COMPLETE
Height: 67 in
Weight: 2811.2 oz

## 2020-03-20 LAB — GLUCOSE, CAPILLARY: Glucose-Capillary: 89 mg/dL (ref 70–99)

## 2020-03-20 LAB — HIV ANTIBODY (ROUTINE TESTING W REFLEX): HIV Screen 4th Generation wRfx: NONREACTIVE

## 2020-03-20 LAB — MAGNESIUM: Magnesium: 1.8 mg/dL (ref 1.7–2.4)

## 2020-03-20 MED ORDER — POTASSIUM CHLORIDE CRYS ER 20 MEQ PO TBCR
40.0000 meq | EXTENDED_RELEASE_TABLET | Freq: Once | ORAL | Status: AC
  Administered 2020-03-20: 40 meq via ORAL
  Filled 2020-03-20: qty 2

## 2020-03-20 MED ORDER — LACTATED RINGERS IV BOLUS
1000.0000 mL | Freq: Once | INTRAVENOUS | Status: AC
  Administered 2020-03-20: 1000 mL via INTRAVENOUS

## 2020-03-20 NOTE — Progress Notes (Addendum)
Subjective:  Patient states she is feeling very well. Slept comfortably overnight, only waking up a few times to use the bathroom. She denies any dizziness or lightheadedness since her initial syncopal episode and is feeling encouraged about the likelihood of going home later today. Also denies SOB, palpitations, nausea, vomiting, or urinary incontinence. Of note, she mentioned she has been on her chlorthalidone for a while now and her blood pressures have been so well controlled that her PCP was considering eventually stopping it altogether. She states she has lost about 20 lbs over the last year and confirms that this was her first ever syncopal episode.   Objective:  Vital signs in last 24 hours: Vitals:   03/20/20 0003 03/20/20 0454 03/20/20 0732 03/20/20 0743  BP: 126/85  125/77 113/75  Pulse: 62  (!) 50 61  Resp: 18 18 20 18   Temp: 97.7 F (36.5 C) (!) 97.5 F (36.4 C) 97.7 F (36.5 C) 97.6 F (36.4 C)  TempSrc: Oral Oral Oral Oral  SpO2: 98% 99% 100% 94%  Weight: 79.7 kg     Height:       Weight change:   Intake/Output Summary (Last 24 hours) at 03/20/2020 0847 Last data filed at 03/20/2020 0500 Gross per 24 hour  Intake 1500 ml  Output 700 ml  Net 800 ml   Physical Exam Constitutional:      General: Laying in bed, in no acute distress.    Appearance: Not ill-appearing or diaphoretic.  HENT:     Head: Normocephalic and atraumatic.  Eyes:     General: No scleral icterus.    Extraocular Movements: Extraocular movements intact.     Conjunctiva/sclera: Conjunctivae normal.  Cardiovascular:     Rate and Rhythm: Normal rate and regular rhythm.     Heart sounds: No murmurs, rubs, or gallops.   Pulmonary:     Effort: Pulmonary effort is normal.     Breath sounds: Normal breath sounds. No wheezing, rhonchi or rales.  Abdominal:     General: Abdomen is flat. Bowel sounds are normal. There is no distension.     Palpations: Abdomen is soft.     Tenderness: There is no  abdominal tenderness.  Musculoskeletal:        General: Normal range of motion. No lower extremity edema bilaterally.  Skin:    General: Skin is warm and dry.  Neurological:     Mental Status: Patient is alert. Mental status is at baseline.     Motor: No weakness.     Sensory: Grossly intact throughout.      Assessment/Plan:  Principal Problem:   Syncope  Ms. Hughett is a 69yo F w/ hx of HTN who presented following a syncopal episode with seizure-like semiology and was found to have hypokalemia and orthostatic hypotension, most consistent with convulsive syncope secondary to hypovolemia in the setting of diuretic use.   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 No postictal period. Orthostatic and hypokalemic in the ED. Patient described losing ~20 lbs over the last year. Her presentation and history of chlorthalidone use in the setting of gradual weight loss are most consistent with convulsive syncope from cerebral hypoperfusion. Diuretic has been held. She is euvolemic on exam and has had no further symptoms such as dizziness, orthostasis, or incontinence. Will repeat orthostatics, and if stable, should be stable for discharge with discontinuation of  her home diuretic.  - Repeat orthostatics pending, prepare discharge if normal  - Chlorthalidone held, f/u with PCP for BP managment  Hypokalemia 2/2 thiazide diuretic: Was found to be hypokalemic (2.5) with orthostatic hypotension, consistent with associated effects of chlorthalidone use. EKG notable for first degree AV block. Potassium was repleted in the ED, most recent level 3.2. Has received another KCl tablet since. No further symptoms noted. - D/c telemetry  - Chorthalidone held, as above  HTN: Takes 25 mg chlorthalidone daily at home. Per patient, BP has been  well controlled over the last year and has had discussions with her PCP about stopping completely. Has lost 20 lbs over the last year and possibly may no longer need use of diuretic. Will hold for now and patient should f/u with PCP. - Hold home chlorthalidone and f/u BP managment outpatient   LOS: 0 days   Trina Ao, Medical Student 03/20/2020, 8:47 AM Pager: [phone]

## 2020-03-20 NOTE — Discharge Instructions (Signed)
Beth Stone,   It has been a pleasure working with you and we are glad you're feeling better. You were hospitalized for passing out. We did some studies and it looks like your blood pressure was dropping when you stand up and your potassium was a little low. We gave you some fluids and replaced the potassium. This may be due to your chlorthalidone medication. Please stop taking that medication and follow up with your primary care about your blood pressure.    Follow up with your primary care provider in 1-2 weeks  If your symptoms worsen or you develop new symptoms, please seek medical help whether it is your primary care provider or emergency department.  If you have any questions about this hospitalization please call [phone].

## 2020-03-20 NOTE — Discharge Summary (Addendum)
Name: Beth Stone MRN: [id number] DOB: 1951/06/18 69 y.o. PCP: Saralyn Pilar, MD  Date of Admission: 03/19/2020  5:15 PM Date of Discharge: 03/20/2020 Attending Physician: Velna Ochs, MD  Discharge Diagnosis: 1. Syncope 2/2 chlorthalidone 2. Hypokalemia  Discharge Medications: Allergies as of 03/20/2020       Reactions   Penicillins Hives, Rash   Did it involve swelling of the face/tongue/throat, SOB, or low BP? N Did it involve sudden or severe rash/hives, skin peeling, or any reaction on the inside of your mouth or nose? Y Did you need to seek medical attention at a hospital or doctor's office? Y When did it last happen? 30 years ago      If all above answers are "NO", may proceed with cephalosporin use.        Medication List     STOP taking these medications    chlorthalidone 25 MG tablet Commonly known as: HYGROTON       TAKE these medications    aspirin 81 MG tablet Take 81 mg by mouth daily.   TURMERIC PO Take by mouth as needed.        Disposition and follow-up:   Ms.Beth Stone was discharged from [facility] in Stable condition.  At the hospital follow up visit please address:  1. Syncope 2/2 chlorthalidone: Stopped BP medications, please reassess BP and consider orthostatics if indicated Hypokalemia: Improved to 3.2, repleted prior to discharge, please repeat BMP  2.  Labs / imaging needed at time of follow-up: BMP  3.  Pending labs/ test needing follow-up: None  Follow-up Appointments:  Follow-up Information     Powers, Charolotte Eke, MD. Schedule an appointment as soon as possible for a visit in 1 week(s).   Specialty: Family Medicine Contact information: [address] [place] [address] [phone]                 Hospital Course by problem list: Admitted 03/19/2020  Allergies: Penicillins Pertinent Hx: HTN controlled with chlorthalidone  69 y.o. female who  presented following a syncopal episode with brief loss of consciousness.  1. Syncope: Presented after a syncopal episode from home. In the ED, was found to be hypokalemic (2.5) with positive orthostatic vitals. Volume status and potassium were treated with NS and Kcl, respectively and patient was admitted to Internal Medicine for observation. Her home chlorthalidone was held due to initial hypovolemic presentation. Overnight, she was without complaints denying further episodes of dizziness. By the morning of 7/3, repeat orthostatic testing was borderline-normal without symptoms, so was given an additional 1L bolus of LR. Afterwards, she was deemed stable for discharge with discontinuation of her home diuretic. Although she has been on chlorthalidone for many years, she recent lost 20 lbs. Please reassess BP and need for treatment at follow up.  2. Hypokalemia: Was found in the ED as stated above (level 2.5). EKG was notable for PR elongation consistent with first degree AV block. Received 1 L of NS, 60 mEq of IV potassium, IV Zofran in ED. Upon transfer to Internal Medicine, potassium was further repleted and was placed on cardiac telemetry. By the morning of 7/3, potassium was 3.2 so was given an additional dose of PO potassium. Telemetry was stable throughout admission.  Discharge Vitals:   BP 113/75 (BP Location: Right Arm)   Pulse 61   Temp 97.6 F (36.4 C) (Oral)   Resp 18   Ht 5\' 7"  (1.702 m)  Wt 79.7 kg Comment: scale a  SpO2 94%   BMI 27.52 kg/m   Pertinent Labs, Studies, and Procedures:  CBC Latest Ref Rng & Units 03/19/2020 09/15/2008  WBC 4.0 - 10.5 K/uL 11.4(H) 8.5  Hemoglobin 12.0 - 15.0 g/dL 13.6 14.7  Hematocrit 36 - 46 % 42.1 44.7  Platelets 150 - 400 K/uL 280 306   BMP Latest Ref Rng & Units 03/20/2020 03/19/2020 03/03/2010  Glucose 70 - 99 mg/dL 103(H) 121(H) 89  BUN 8 - 23 mg/dL 10 18 16   Creatinine 0.44 - 1.00 mg/dL 0.86 0.97 0.92  Sodium 135 - 145 mmol/L 141 141 139    Potassium 3.5 - 5.1 mmol/L 3.2(L) 2.5(LL) 3.6  Chloride 98 - 111 mmol/L 106 104 103  CO2 22 - 32 mmol/L 27 26 25   Calcium 8.9 - 10.3 mg/dL 8.6(L) 8.6(L) 8.8   Discharge Instructions: Discharge Instructions     Call MD for:  difficulty breathing, headache or visual disturbances   Complete by: As directed    Call MD for:  extreme fatigue   Complete by: As directed    Call MD for:  persistant dizziness or light-headedness   Complete by: As directed    Call MD for:  persistant nausea and vomiting   Complete by: As directed    Call MD for:  severe uncontrolled pain   Complete by: As directed    Diet - low sodium heart healthy   Complete by: As directed    Increase activity slowly   Complete by: As directed        Signed: Asencion Noble, MD 03/21/2020, 4:41 PM   Pager: [phone]

## 2020-03-20 NOTE — Progress Notes (Signed)
  Echocardiogram 2D Echocardiogram has been performed.  Beth Stone 03/20/2020, 1:50 PM

## 2020-03-20 NOTE — Progress Notes (Signed)
   03/20/20 0743  Assess: MEWS Score  Temp 97.6 F (36.4 C)  BP 113/75  Pulse Rate 61  Resp 18  SpO2 94 %  O2 Device Room Air  O2 Flow Rate (L/min) 0 L/min  Assess: MEWS Score  MEWS Temp 0  MEWS Systolic 0  MEWS Pulse 0  MEWS RR 0  MEWS LOC 0  MEWS Score 0  MEWS Score Color Green  Assess: if the MEWS score is Yellow or Red  Were vital signs taken at a resting state? Yes  Focused Assessment Documented focused assessment  Early Detection of Sepsis Score *See Row Information* Low  MEWS guidelines implemented *See Row Information* No, vital signs rechecked   Patient is alert and oriented x4. Patient's VS was taken at a resting state and patient expresses no distress. Previous VS was validated with clerical error. Will continue to monitor.

## 2020-03-20 NOTE — Plan of Care (Signed)

## 2020-03-20 NOTE — Progress Notes (Signed)
LR bolus is completed and ECHO has been completed. Went over discharge instructions with patient. Patient verbalizes understanding and understanding about a follow up with PCP in a week. Tele monitor has been removed and CCMD has been notified. Peripheral's IV have been removed. Patient's daughter is at the bedside.

## 2023-04-17 ENCOUNTER — Encounter (HOSPITAL_BASED_OUTPATIENT_CLINIC_OR_DEPARTMENT_OTHER): Payer: Self-pay | Admitting: Emergency Medicine

## 2023-04-17 ENCOUNTER — Emergency Department (HOSPITAL_BASED_OUTPATIENT_CLINIC_OR_DEPARTMENT_OTHER): Payer: Medicare HMO

## 2023-04-17 ENCOUNTER — Inpatient Hospital Stay (HOSPITAL_BASED_OUTPATIENT_CLINIC_OR_DEPARTMENT_OTHER)
Admission: EM | Admit: 2023-04-17 | Discharge: 2023-04-28 | DRG: 335 | Disposition: A | Discharge status: Home / Self Care | Payer: Medicare HMO | Attending: Internal Medicine | Admitting: Internal Medicine

## 2023-04-17 DIAGNOSIS — E876 Hypokalemia: Secondary | ICD-10-CM | POA: Diagnosis present

## 2023-04-17 DIAGNOSIS — Z880 Allergy status to penicillin: Secondary | ICD-10-CM

## 2023-04-17 DIAGNOSIS — I10 Essential (primary) hypertension: Secondary | ICD-10-CM | POA: Diagnosis present

## 2023-04-17 DIAGNOSIS — R1084 Generalized abdominal pain: Secondary | ICD-10-CM

## 2023-04-17 DIAGNOSIS — K5651 Intestinal adhesions [bands], with partial obstruction: Principal | ICD-10-CM | POA: Diagnosis present

## 2023-04-17 DIAGNOSIS — E43 Unspecified severe protein-calorie malnutrition: Secondary | ICD-10-CM | POA: Diagnosis present

## 2023-04-17 DIAGNOSIS — Z888 Allergy status to other drugs, medicaments and biological substances status: Secondary | ICD-10-CM

## 2023-04-17 DIAGNOSIS — Z6827 Body mass index (BMI) 27.0-27.9, adult: Secondary | ICD-10-CM

## 2023-04-17 DIAGNOSIS — K56609 Unspecified intestinal obstruction, unspecified as to partial versus complete obstruction: Principal | ICD-10-CM

## 2023-04-17 DIAGNOSIS — D72828 Other elevated white blood cell count: Secondary | ICD-10-CM | POA: Diagnosis present

## 2023-04-17 DIAGNOSIS — R109 Unspecified abdominal pain: Secondary | ICD-10-CM | POA: Diagnosis not present

## 2023-04-17 DIAGNOSIS — Z90711 Acquired absence of uterus with remaining cervical stump: Secondary | ICD-10-CM

## 2023-04-17 DIAGNOSIS — K529 Noninfective gastroenteritis and colitis, unspecified: Secondary | ICD-10-CM | POA: Diagnosis present

## 2023-04-17 DIAGNOSIS — Z7982 Long term (current) use of aspirin: Secondary | ICD-10-CM

## 2023-04-17 LAB — URINALYSIS, ROUTINE W REFLEX MICROSCOPIC
Bacteria, UA: NONE SEEN
Bilirubin Urine: NEGATIVE
Glucose, UA: NEGATIVE mg/dL
Nitrite: NEGATIVE
Protein, ur: 30 mg/dL — AB
Specific Gravity, Urine: 1.034 — ABNORMAL HIGH (ref 1.005–1.030)
pH: 6.5 (ref 5.0–8.0)

## 2023-04-17 LAB — CBC
HCT: 45.8 % (ref 36.0–46.0)
Hemoglobin: 15.1 g/dL — ABNORMAL HIGH (ref 12.0–15.0)
MCH: 30.1 pg (ref 26.0–34.0)
MCHC: 33 g/dL (ref 30.0–36.0)
MCV: 91.2 fL (ref 80.0–100.0)
Platelets: 238 10*3/uL (ref 150–400)
RBC: 5.02 MIL/uL (ref 3.87–5.11)
RDW: 12.6 % (ref 11.5–15.5)
WBC: 13 10*3/uL — ABNORMAL HIGH (ref 4.0–10.5)
nRBC: 0 % (ref 0.0–0.2)

## 2023-04-17 LAB — COMPREHENSIVE METABOLIC PANEL
ALT: 15 U/L (ref 0–44)
AST: 27 U/L (ref 15–41)
Albumin: 4.3 g/dL (ref 3.5–5.0)
Alkaline Phosphatase: 77 U/L (ref 38–126)
Anion gap: 13 (ref 5–15)
BUN: 18 mg/dL (ref 8–23)
CO2: 25 mmol/L (ref 22–32)
Calcium: 9.8 mg/dL (ref 8.9–10.3)
Chloride: 100 mmol/L (ref 98–111)
Creatinine, Ser: 0.82 mg/dL (ref 0.44–1.00)
GFR, Estimated: >60 mL/min (ref >60)
Glucose, Bld: 141 mg/dL — ABNORMAL HIGH (ref 70–99)
Potassium: 3.4 mmol/L — ABNORMAL LOW (ref 3.5–5.1)
Sodium: 138 mmol/L (ref 135–145)
Total Bilirubin: 0.5 mg/dL (ref 0.3–1.2)
Total Protein: 8.2 g/dL — ABNORMAL HIGH (ref 6.5–8.1)

## 2023-04-17 LAB — LIPASE, BLOOD: Lipase: 43 U/L (ref 11–51)

## 2023-04-17 MED ORDER — FENTANYL CITRATE PF 50 MCG/ML IJ SOSY
50.0000 ug | PREFILLED_SYRINGE | Freq: Once | INTRAMUSCULAR | Status: AC
  Administered 2023-04-17: 50 ug via INTRAVENOUS
  Filled 2023-04-17: qty 1

## 2023-04-17 MED ORDER — ONDANSETRON HCL 4 MG/2ML IJ SOLN
4.0000 mg | Freq: Four times a day (QID) | INTRAMUSCULAR | Status: DC | PRN
  Administered 2023-04-17 – 2023-04-25 (×10): 4 mg via INTRAVENOUS
  Filled 2023-04-17 (×10): qty 2

## 2023-04-17 MED ORDER — LACTATED RINGERS IV BOLUS
1000.0000 mL | Freq: Once | INTRAVENOUS | Status: AC
  Administered 2023-04-17: 1000 mL via INTRAVENOUS

## 2023-04-17 MED ORDER — IOHEXOL 300 MG/ML  SOLN
100.0000 mL | Freq: Once | INTRAMUSCULAR | Status: AC | PRN
  Administered 2023-04-17: 80 mL via INTRAVENOUS

## 2023-04-17 NOTE — ED Triage Notes (Signed)
Abdominal cramps started in AM, N/v+ Constipation. Tried glycerin supp today around 5pm without results

## 2023-04-17 NOTE — ED Provider Notes (Signed)
EMERGENCY DEPARTMENT AT [facility] Provider Note   CSN: [id number] Arrival date & time: 04/17/23  1927     History  Chief Complaint  Patient presents with   Abdominal Pain   Constipation    Beth Stone is a 72 y.o. female.   Abdominal Pain Associated symptoms: constipation, nausea and vomiting   Constipation Associated symptoms: abdominal pain, nausea and vomiting   Patient presents for abdominal pain.  Medical history includes HTN.  She reports a small firm stools that have been ongoing for the past several months.  She does not take a stool softener.  She just ate oatmeal to get the fiber.  Starting this morning, she has been experiencing generalized abdominal pain.  Pain described as cramping in nature.  She did have a very small bowel movement this morning.  She has had nausea and vomiting throughout the day.  She has not been able to tolerate p.o. intake since this morning.  She tried a glycerin suppository this afternoon but has not had any further bowel movements.  Currently, she endorses ongoing generalized abdominal pain.  She denies any current nausea.      Home Medications Prior to Admission medications   Medication Sig Start Date End Date Taking? Authorizing Provider  aspirin 81 MG tablet Take 81 mg by mouth daily.     [provider]  TURMERIC PO Take by mouth as needed. Patient not taking: Reported on 03/19/2020    [provider]      Allergies    Penicillins    Review of Systems   Review of Systems  Gastrointestinal:  Positive for abdominal pain, constipation, nausea and vomiting.  All other systems reviewed and are negative.   Physical Exam Updated Vital Signs BP 122/86 (BP Location: Left Arm)   Pulse 63   Temp 98.1 F (36.7 C)   Resp 20   SpO2 93%  Physical Exam Vitals and nursing note reviewed.  Constitutional:      General: She is not in acute distress.    Appearance: She is well-developed. She is  not ill-appearing, toxic-appearing or diaphoretic.  HENT:     Head: Normocephalic and atraumatic.  Eyes:     Conjunctiva/sclera: Conjunctivae normal.  Cardiovascular:     Rate and Rhythm: Normal rate and regular rhythm.  Pulmonary:     Effort: Pulmonary effort is normal. No respiratory distress.  Abdominal:     General: There is no distension.     Palpations: Abdomen is soft.     Tenderness: There is generalized abdominal tenderness. There is no guarding or rebound.  Musculoskeletal:        General: No swelling.     Cervical back: Neck supple.  Skin:    General: Skin is warm and dry.     Coloration: Skin is not cyanotic, jaundiced or pale.  Neurological:     General: No focal deficit present.     Mental Status: She is alert and oriented to person, place, and time.  Psychiatric:        Mood and Affect: Mood normal.        Behavior: Behavior normal.     ED Results / Procedures / Treatments   Labs (all labs ordered are listed, but only abnormal results are displayed) Labs Reviewed  CBC - Abnormal; Notable for the following components:      Result Value   WBC 13.0 (*)    Hemoglobin 15.1 (*)    All other  components within normal limits  URINALYSIS, ROUTINE W REFLEX MICROSCOPIC - Abnormal; Notable for the following components:   Specific Gravity, Urine 1.034 (*)    Hgb urine dipstick SMALL (*)    Ketones, ur TRACE (*)    Protein, ur 30 (*)    Leukocytes,Ua SMALL (*)    All other components within normal limits  COMPREHENSIVE METABOLIC PANEL  LIPASE, BLOOD    EKG None  Radiology No results found.  Procedures Procedures    Medications Ordered in ED Medications  ondansetron (ZOFRAN) injection 4 mg (4 mg Intravenous Given 04/17/23 2231)  lactated ringers bolus 1,000 mL (1,000 mLs Intravenous New Bag/Given 04/17/23 2231)  fentaNYL (SUBLIMAZE) injection 50 mcg (50 mcg Intravenous Given 04/17/23 2232)    ED Course/ Medical Decision Making/ A&P                                  Medical Decision Making Amount and/or Complexity of Data Reviewed Labs: ordered. Radiology: ordered.  Risk Prescription drug management.   This patient presents to the ED for concern of abdominal pain, this involves an extensive number of treatment options, and is a complaint that carries with it a high risk of complications and morbidity.  The differential diagnosis includes constipation, bowel obstruction, neoplasm, enteritis, colitis   Co morbidities that complicate the patient evaluation  HTN   Additional history obtained:  Additional history obtained from N/A External records from outside source obtained and reviewed including EMR   Lab Tests:  I Ordered, and personally interpreted labs.  The pertinent results include: Urinalysis shows trace hematuria.  CBC shows leukocytosis with elevated hemoglobin from baseline, suggestive of hemoconcentration.  Remaining lab work is pending at time of signout.   Imaging Studies ordered:  I ordered imaging studies including CT of abdomen and pelvis I independently visualized and interpreted imaging which showed (pending at time of signout) I agree with the radiologist interpretation   Cardiac Monitoring: / EKG:  The patient was maintained on a cardiac monitor.  I personally viewed and interpreted the cardiac monitored which showed an underlying rhythm of: Sinus rhythm   Problem List / ED Course / Critical interventions / Medication management  Patient presenting for generalized abdominal pain, nausea, vomiting, and p.o. intolerance today.  Vital signs normal on arrival.  Patient is overall well-appearing on exam.  Her abdomen is soft.  She does endorse some tenderness to the palpation.  Given her fluid losses today, IV fluids were ordered for hydration.  Fentanyl was ordered for analgesia.  She is not currently nauseous.  Initial lab work is notable for a leukocytosis and increased hemoglobin from baseline, suggestive of  hemoconcentration.  Remaining workup pending at time of signout.  Care of patient signed out to oncoming provider. I ordered medication including IV fluids for hydration; fentanyl for analgesia; Zofran for nausea Reevaluation of the patient after these medicines showed that the patient improved I have reviewed the patients home medicines and have made adjustments as needed   Social Determinants of Health:  Has PCP        Final Clinical Impression(s) / ED Diagnoses Final diagnoses:  Generalized abdominal pain    Rx / DC Orders ED Discharge Orders     None         Gloris Manchester, MD 04/17/23 2355

## 2023-04-18 ENCOUNTER — Inpatient Hospital Stay (HOSPITAL_COMMUNITY): Payer: Medicare HMO

## 2023-04-18 ENCOUNTER — Other Ambulatory Visit: Payer: Self-pay

## 2023-04-18 DIAGNOSIS — Z90711 Acquired absence of uterus with remaining cervical stump: Secondary | ICD-10-CM | POA: Diagnosis not present

## 2023-04-18 DIAGNOSIS — K56609 Unspecified intestinal obstruction, unspecified as to partial versus complete obstruction: Secondary | ICD-10-CM | POA: Diagnosis not present

## 2023-04-18 DIAGNOSIS — Z888 Allergy status to other drugs, medicaments and biological substances status: Secondary | ICD-10-CM | POA: Diagnosis not present

## 2023-04-18 DIAGNOSIS — D72828 Other elevated white blood cell count: Secondary | ICD-10-CM | POA: Diagnosis present

## 2023-04-18 DIAGNOSIS — K565 Intestinal adhesions [bands], unspecified as to partial versus complete obstruction: Secondary | ICD-10-CM | POA: Diagnosis not present

## 2023-04-18 DIAGNOSIS — K5651 Intestinal adhesions [bands], with partial obstruction: Secondary | ICD-10-CM | POA: Diagnosis present

## 2023-04-18 DIAGNOSIS — E43 Unspecified severe protein-calorie malnutrition: Secondary | ICD-10-CM | POA: Diagnosis present

## 2023-04-18 DIAGNOSIS — I10 Essential (primary) hypertension: Secondary | ICD-10-CM | POA: Diagnosis present

## 2023-04-18 DIAGNOSIS — Z6827 Body mass index (BMI) 27.0-27.9, adult: Secondary | ICD-10-CM | POA: Diagnosis not present

## 2023-04-18 DIAGNOSIS — Z7982 Long term (current) use of aspirin: Secondary | ICD-10-CM | POA: Diagnosis not present

## 2023-04-18 DIAGNOSIS — E876 Hypokalemia: Secondary | ICD-10-CM | POA: Diagnosis present

## 2023-04-18 DIAGNOSIS — K529 Noninfective gastroenteritis and colitis, unspecified: Secondary | ICD-10-CM | POA: Diagnosis present

## 2023-04-18 DIAGNOSIS — Z880 Allergy status to penicillin: Secondary | ICD-10-CM | POA: Diagnosis not present

## 2023-04-18 DIAGNOSIS — R109 Unspecified abdominal pain: Secondary | ICD-10-CM | POA: Diagnosis present

## 2023-04-18 MED ORDER — ACETAMINOPHEN 325 MG PO TABS
650.0000 mg | ORAL_TABLET | Freq: Four times a day (QID) | ORAL | Status: DC | PRN
  Administered 2023-04-18 – 2023-04-19 (×2): 650 mg via ORAL
  Filled 2023-04-18 (×2): qty 2

## 2023-04-18 MED ORDER — DIATRIZOATE MEGLUMINE & SODIUM 66-10 % PO SOLN
90.0000 mL | Freq: Once | ORAL | Status: AC
  Administered 2023-04-18: 90 mL via ORAL
  Filled 2023-04-18: qty 90

## 2023-04-18 MED ORDER — ALBUTEROL SULFATE (2.5 MG/3ML) 0.083% IN NEBU: 2.5000 mg | INHALATION_SOLUTION | RESPIRATORY_TRACT | Status: DC | PRN

## 2023-04-18 MED ORDER — METOCLOPRAMIDE HCL 5 MG/ML IJ SOLN
10.0000 mg | Freq: Four times a day (QID) | INTRAMUSCULAR | Status: DC | PRN
  Administered 2023-04-18 – 2023-04-19 (×2): 10 mg via INTRAVENOUS
  Filled 2023-04-18 (×2): qty 2

## 2023-04-18 MED ORDER — POTASSIUM CHLORIDE 2 MEQ/ML IV SOLN
INTRAVENOUS | Status: DC
  Filled 2023-04-18 (×40): qty 1000

## 2023-04-18 MED ORDER — ENOXAPARIN SODIUM 40 MG/0.4ML IJ SOSY
40.0000 mg | PREFILLED_SYRINGE | INTRAMUSCULAR | Status: DC
  Administered 2023-04-18 – 2023-04-27 (×10): 40 mg via SUBCUTANEOUS
  Filled 2023-04-18 (×11): qty 0.4

## 2023-04-18 MED ORDER — ACETAMINOPHEN 650 MG RE SUPP: 650.0000 mg | Freq: Four times a day (QID) | RECTAL | Status: DC | PRN

## 2023-04-18 MED ORDER — MORPHINE SULFATE (PF) 2 MG/ML IV SOLN
2.0000 mg | INTRAVENOUS | Status: DC | PRN
  Administered 2023-04-18 – 2023-04-21 (×20): 2 mg via INTRAVENOUS
  Filled 2023-04-18 (×20): qty 1

## 2023-04-18 MED ORDER — HYDRALAZINE HCL 20 MG/ML IJ SOLN
5.0000 mg | INTRAMUSCULAR | Status: DC | PRN
  Administered 2023-04-18 – 2023-04-22 (×3): 5 mg via INTRAVENOUS
  Filled 2023-04-18 (×3): qty 1

## 2023-04-18 NOTE — Consult Note (Signed)
MLYNN WEINKAUF 1951/01/07  [id number].    Requesting MD: Dr. Teena Dunk Chief Complaint/Reason for Consult: SBO  HPI: Beth Stone is a 72 y.o. female with hx of HTN who presented to the ED with abdominal pain. She reports yesterday she started having generalized abdominal pain with associated n/v. Last episode of vomiting was last night. Her abdominal pain and nausea have resolved today. She has not needed any prn medications since ~0607. She still is not passing flatus and has not had a bm since onset. She reports several month hx of constipation with very small hard bm's every couple of days. She has tried dulcolax for this x 1 several weeks ago without relief. She is not on a daily bowel regimen. Her last colonoscopy was in 2020 that showed diverticula and benign polyps. Hx of prior hysterectomy and b/l inguinal hernia repairs. She is not on blood thinners other than a baby asa.   ROS: ROS As above, see hpi  Family History  Problem Relation Age of Onset   Healthy Sister    Colon cancer Neg Hx     Past Medical History:  Diagnosis Date   Allergy    seasonal   Cataract    Bil/ no surgery yet.   Hypertension     Past Surgical History:  Procedure Laterality Date   COLONOSCOPY     HERNIA REPAIR     x2/inguinal   PARTIAL HYSTERECTOMY     was complete hysterectomy per pt    Social History:  reports that she has never smoked. She has never used smokeless tobacco. She reports that she does not drink alcohol and does not use drugs.  Allergies:  Allergies  Allergen Reactions   Atorvastatin Other (See Comments)    Myalgia   Penicillins Hives and Rash   Rosuvastatin Other (See Comments)    Tingling in feet    Medications Prior to Admission  Medication Sig Dispense Refill   acetaminophen (TYLENOL) 650 MG CR tablet Take 1,300 mg by mouth as needed for pain.     aspirin 81 MG tablet Take 81 mg by mouth daily.      Multiple Vitamins-Minerals (MULTIVITAMIN  WITH MINERALS) tablet Take 1 tablet by mouth daily.     Polyvinyl Alcohol-Povidone (REFRESH OP) Place 1 drop into both eyes as needed (dry eye).       Physical Exam: Blood pressure 131/85, pulse 66, temperature 98.7 F (37.1 C), temperature source Oral, resp. rate 16, SpO2 99%. Gen:  Alert, NAD, pleasant Card:  RRR Pulm:  CTAB, no W/R/R, effort normal Abd: Soft, mild distension, NT, +BS Psych: A&Ox3  Skin: no rashes noted, warm and dry  Results for orders placed or performed during the hospital encounter of 04/17/23 (from the past 48 hour(s))  CBC     Status: Abnormal   Collection Time: 04/17/23  7:58 PM  Result Value Ref Range   WBC 13.0 (H) 4.0 - 10.5 K/uL   RBC 5.02 3.87 - 5.11 MIL/uL   Hemoglobin 15.1 (H) 12.0 - 15.0 g/dL   HCT 46.9 62.9 - 52.8 %   MCV 91.2 80.0 - 100.0 fL   MCH 30.1 26.0 - 34.0 pg   MCHC 33.0 30.0 - 36.0 g/dL   RDW 41.3 24.4 - 01.0 %   Platelets 238 150 - 400 K/uL   nRBC 0.0 0.0 - 0.2 %    Comment: Performed at [facility], [address], [place], [place] [address]  Urinalysis,  Routine w reflex microscopic -Urine, Clean Catch     Status: Abnormal   Collection Time: 04/17/23  7:58 PM  Result Value Ref Range   Color, Urine YELLOW YELLOW   APPearance CLEAR CLEAR   Specific Gravity, Urine 1.034 (H) 1.005 - 1.030   pH 6.5 5.0 - 8.0   Glucose, UA NEGATIVE NEGATIVE mg/dL   Hgb urine dipstick SMALL (A) NEGATIVE   Bilirubin Urine NEGATIVE NEGATIVE   Ketones, ur TRACE (A) NEGATIVE mg/dL   Protein, ur 30 (A) NEGATIVE mg/dL   Nitrite NEGATIVE NEGATIVE   Leukocytes,Ua SMALL (A) NEGATIVE   RBC / HPF 21-50 0 - 5 RBC/hpf   WBC, UA 0-5 0 - 5 WBC/hpf   Bacteria, UA NONE SEEN NONE SEEN   Squamous Epithelial / HPF 0-5 0 - 5 /HPF   Mucus PRESENT     Comment: Performed at [facility], [address], [place], [place] [address]  Comprehensive metabolic panel     Status: Abnormal   Collection Time: 04/17/23  9:59 PM  Result  Value Ref Range   Sodium 138 135 - 145 mmol/L   Potassium 3.4 (L) 3.5 - 5.1 mmol/L   Chloride 100 98 - 111 mmol/L   CO2 25 22 - 32 mmol/L   Glucose, Bld 141 (H) 70 - 99 mg/dL    Comment: Glucose reference range applies only to samples taken after fasting for at least 8 hours.   BUN 18 8 - 23 mg/dL   Creatinine, Ser 6.04 0.44 - 1.00 mg/dL   Calcium 9.8 8.9 - 54.0 mg/dL   Total Protein 8.2 (H) 6.5 - 8.1 g/dL   Albumin 4.3 3.5 - 5.0 g/dL   AST 27 15 - 41 U/L   ALT 15 0 - 44 U/L   Alkaline Phosphatase 77 38 - 126 U/L   Total Bilirubin 0.5 0.3 - 1.2 mg/dL   GFR, Estimated >98 >11 mL/min    Comment: (NOTE) Calculated using the CKD-EPI Creatinine Equation (2021)    Anion gap 13 5 - 15    Comment: Performed at [facility], [address] [place], [place] [address]  Lipase, blood     Status: None   Collection Time: 04/17/23  9:59 PM  Result Value Ref Range   Lipase 43 11 - 51 U/L    Comment: Performed at [facility], [address], [place], [place] [address]   CT ABDOMEN PELVIS W CONTRAST  Result Date: 04/18/2023 CLINICAL DATA:  Abdominal pain. EXAM: CT ABDOMEN AND PELVIS WITH CONTRAST TECHNIQUE: Multidetector CT imaging of the abdomen and pelvis was performed using the standard protocol following bolus administration of intravenous contrast. RADIATION DOSE REDUCTION: This exam was performed according to the departmental dose-optimization program which includes automated exposure control, adjustment of the mA and/or kV according to patient size and/or use of iterative reconstruction technique. CONTRAST:  80mL OMNIPAQUE IOHEXOL 300 MG/ML  SOLN COMPARISON:  None Available. FINDINGS: Lower chest: The visualized lung bases are clear. No intra-abdominal free air. Small free fluid in the abdomen pelvis. Hepatobiliary: The liver is unremarkable. No biliary dilatation. The gallbladder is unremarkable. Pancreas: Unremarkable. No pancreatic ductal dilatation or  surrounding inflammatory changes. Spleen: Normal in size without focal abnormality. Adrenals/Urinary Tract: The adrenal glands unremarkable. There is no hydronephrosis on either side. There is symmetric enhancement and excretion of contrast by both kidneys. The visualized ureters and urinary bladder appear unremarkable. Stomach/Bowel: Small hiatal hernia. Multiple top normal caliber fluid-filled loops of small bowel  measure up to 2.8 cm in caliber. There is stranding and edema in the associated mesentery. The terminal ileum and a loop of distal small bowel in the pelvis are collapsed. Findings favored to represent enteritis with associated ileus. Developing obstruction with transition in distal ileum in the right hemipelvis is not excluded. Clinical correlation is recommended. A small-bowel series may provide better evaluation. There is sigmoid diverticulosis without active inflammatory changes. The appendix is normal. Vascular/Lymphatic: Mild aortoiliac atherosclerotic disease. The IVC is unremarkable. No portal venous gas. There is no adenopathy. Reproductive: Hysterectomy.  No adnexal masses. Other: None Musculoskeletal: Degenerative changes of the spine. No acute osseous pathology. IMPRESSION: 1. Findings favored to represent enteritis with associated ileus. Developing obstruction is not excluded. Small-bowel series may provide better evaluation. 2. Sigmoid diverticulosis. Normal appendix. 3.  Aortic Atherosclerosis (ICD10-I70.0). Electronically Signed   By: Elgie Collard M.D.   On: 04/18/2023 00:13    Anti-infectives (From admission, onward)    None       Assessment/Plan SBO - CT w/ dilated small bowel loops with decompressed distal small bowel loops. There is some stranding and edema in the mesentery.  - HDS without fever, tachycardia or hypotension. No peritonitis on exam. WBC pending today. No current indication for emergency surgery - Radiology questions enteritis vs sbo. I think given hx of  prior abdominal surgeries and no bowel function since symptom onset it is reasonable to do the SBO protocol orally. With her improved symptoms and reassuring exam we will allow her to drink liquids. If she develops any worsening abdominal pain, distension, n/v would make npo and let our team know.  - Keep K > 4, Mg > 2 and mobilize as able for bowel function - Hopefully patient will improve with conservative management. If patient fails to improve with conservative management, they may require exploratory surgery during admission - Agree with medical admission. We will follow with you.   FEN - CLD, IVF per [facility] VTE - SCDs, Lovenox ID - None   I reviewed nursing notes, last 24 h vitals and pain scores, last 48 h intake and output, last 24 h labs and trends, and last 24 h imaging results.  Jacinto Halim, [facility] 04/18/2023, 10:39 AM Please see Amion for pager number during day hours 7:00am-4:30pm

## 2023-04-18 NOTE — H&P (Signed)
History and Physical  Beth Stone HYQ:[id number] DOB: 04/04/1951 DOA: 04/17/2023  PCP: Ihor Gully, MD   Chief Complaint: Abdominal pain, vomiting  HPI: Beth Stone is a 72 y.o. female with medical history significant for hypertension, prior hysterectomy and inguinal hernia repair x 2 being admitted to the hospital with concern for partial bowel obstruction.  Patient states that yesterday she started having diffuse lower abdominal pain without radiation, and associated nausea and vomiting.  She vomited a total of 3 times, including once in the ER last evening.  At that time, she has not had any nausea, has only intermittent abdominal discomfort.  Lab work was relatively unremarkable, as were her vital signs.  ET scan raise concern for possible partial small bowel obstruction, patient was kept n.p.o., given IV fluids, and admitted to the hospitalist service.  This morning, I have spoken with general surgery who will consult as well.  Review of Systems: Please see HPI for pertinent positives and negatives. A complete 10 system review of systems are otherwise negative.  Past Medical History:  Diagnosis Date   Allergy    seasonal   Cataract    Bil/ no surgery yet.   Hypertension    Past Surgical History:  Procedure Laterality Date   COLONOSCOPY     HERNIA REPAIR     x2/inguinal   PARTIAL HYSTERECTOMY     was complete hysterectomy per pt    Social History:  reports that she has never smoked. She has never used smokeless tobacco. She reports that she does not drink alcohol and does not use drugs.   Allergies  Allergen Reactions   Atorvastatin Other (See Comments)    Myalgia   Penicillins Hives and Rash   Rosuvastatin Other (See Comments)    Tingling in feet    Family History  Problem Relation Age of Onset   Healthy Sister    Colon cancer Neg Hx      Prior to Admission medications   Medication Sig Start Date End Date Taking? Authorizing Provider   acetaminophen (TYLENOL) 650 MG CR tablet Take 1,300 mg by mouth as needed for pain.   Yes [provider]  aspirin 81 MG tablet Take 81 mg by mouth daily.    Yes [provider]  Multiple Vitamins-Minerals (MULTIVITAMIN WITH MINERALS) tablet Take 1 tablet by mouth daily.   Yes [provider]  Polyvinyl Alcohol-Povidone (REFRESH OP) Place 1 drop into both eyes as needed (dry eye).   Yes [provider]    Physical Exam: BP 131/85 (BP Location: Left Arm)   Pulse 66   Temp 98.7 F (37.1 C) (Oral)   Resp 16   SpO2 99%   General:  Alert, oriented, calm, in no acute distress, looks very comfortable, family at bedside Eyes: EOMI, clear conjuctivae, white sclerea Neck: supple, no masses, trachea mildline  Cardiovascular: RRR, no murmurs or rubs, no peripheral edema  Respiratory: clear to auscultation bilaterally, no wheezes, no crackles  Abdomen: soft, slightly tender in the bilateral lower quadrants, nondistended, normal bowel tones heard  Skin: dry, no rashes  Musculoskeletal: no joint effusions, normal range of motion  Psychiatric: appropriate affect, normal speech  Neurologic: extraocular muscles intact, clear speech, moving all extremities with intact sensorium          Labs on Admission:  Basic Metabolic Panel: Recent Labs  Lab 04/17/23 2159  NA 138  K 3.4*  CL 100  CO2 25  GLUCOSE 141*  BUN 18  CREATININE 0.82  CALCIUM 9.8   Liver Function Tests: Recent Labs  Lab 04/17/23 2159  AST 27  ALT 15  ALKPHOS 77  BILITOT 0.5  PROT 8.2*  ALBUMIN 4.3   Recent Labs  Lab 04/17/23 2159  LIPASE 43   No results for input(s): "AMMONIA" in the last 168 hours. CBC: Recent Labs  Lab 04/17/23 1958  WBC 13.0*  HGB 15.1*  HCT 45.8  MCV 91.2  PLT 238   Cardiac Enzymes: No results for input(s): "CKTOTAL", "CKMB", "CKMBINDEX", "TROPONINI" in the last 168 hours.  BNP (last 3 results) No results for input(s): "BNP" in the last 8760  hours.  ProBNP (last 3 results) No results for input(s): "PROBNP" in the last 8760 hours.  CBG: No results for input(s): "GLUCAP" in the last 168 hours.  Radiological Exams on Admission: CT ABDOMEN PELVIS W CONTRAST  Result Date: 04/18/2023 CLINICAL DATA:  Abdominal pain. EXAM: CT ABDOMEN AND PELVIS WITH CONTRAST TECHNIQUE: Multidetector CT imaging of the abdomen and pelvis was performed using the standard protocol following bolus administration of intravenous contrast. RADIATION DOSE REDUCTION: This exam was performed according to the departmental dose-optimization program which includes automated exposure control, adjustment of the mA and/or kV according to patient size and/or use of iterative reconstruction technique. CONTRAST:  80mL OMNIPAQUE IOHEXOL 300 MG/ML  SOLN COMPARISON:  None Available. FINDINGS: Lower chest: The visualized lung bases are clear. No intra-abdominal free air. Small free fluid in the abdomen pelvis. Hepatobiliary: The liver is unremarkable. No biliary dilatation. The gallbladder is unremarkable. Pancreas: Unremarkable. No pancreatic ductal dilatation or surrounding inflammatory changes. Spleen: Normal in size without focal abnormality. Adrenals/Urinary Tract: The adrenal glands unremarkable. There is no hydronephrosis on either side. There is symmetric enhancement and excretion of contrast by both kidneys. The visualized ureters and urinary bladder appear unremarkable. Stomach/Bowel: Small hiatal hernia. Multiple top normal caliber fluid-filled loops of small bowel measure up to 2.8 cm in caliber. There is stranding and edema in the associated mesentery. The terminal ileum and a loop of distal small bowel in the pelvis are collapsed. Findings favored to represent enteritis with associated ileus. Developing obstruction with transition in distal ileum in the right hemipelvis is not excluded. Clinical correlation is recommended. A small-bowel series may provide better evaluation.  There is sigmoid diverticulosis without active inflammatory changes. The appendix is normal. Vascular/Lymphatic: Mild aortoiliac atherosclerotic disease. The IVC is unremarkable. No portal venous gas. There is no adenopathy. Reproductive: Hysterectomy.  No adnexal masses. Other: None Musculoskeletal: Degenerative changes of the spine. No acute osseous pathology. IMPRESSION: 1. Findings favored to represent enteritis with associated ileus. Developing obstruction is not excluded. Small-bowel series may provide better evaluation. 2. Sigmoid diverticulosis. Normal appendix. 3.  Aortic Atherosclerosis (ICD10-I70.0). Electronically Signed   By: Elgie Collard M.D.   On: 04/18/2023 00:13    Assessment/Plan This is a pleasant and relatively healthy 72 year old female with a history of hypertension and prior abdominal surgery being admitted to the hospital with one day of abdominal discomfort and vomiting, with concern for possible ileus versus partial small bowel obstruction.  There is some mention of possible enteritis, but infectious cause is deemed less likely given no fevers, diarrhea or other obvious infectious symptoms.  She has some mild leukocytosis but this is likely stress demargination from recent vomiting.  Ileus versus partial small bowel obstruction -Observation admission to MedSurg -Keep n.p.o. -IV fluids, increased to 125 cc/h -Would anticipate NG tube if recurrent severe nausea or vomiting -Pain and nausea  control as needed -Discussed with general surgery, who will consult  Hypertension-resume home medications once reconciled  DVT prophylaxis: Lovenox     Code Status: Full Code  Consults called: None  Admission status: Observation  Time spent: 45 minutes  Mannie Wineland Sharlette Dense MD [facility] Pager [phone]  If 7PM-7AM, please contact night-coverage [email or web address] Password [facility]  04/18/2023, 10:44 AM

## 2023-04-18 NOTE — ED Provider Notes (Signed)
Nursing notes and vitals signs, including pulse oximetry, reviewed.  Summary of this visit's results, reviewed by myself:  EKG:  EKG Interpretation Date/Time:    Ventricular Rate:    PR Interval:    QRS Duration:    QT Interval:    QTC Calculation:   R Axis:      Text Interpretation:          Labs:  Results for orders placed or performed during the hospital encounter of 04/17/23 (from the past 24 hour(s))  CBC     Status: Abnormal   Collection Time: 04/17/23  7:58 PM  Result Value Ref Range   WBC 13.0 (H) 4.0 - 10.5 K/uL   RBC 5.02 3.87 - 5.11 MIL/uL   Hemoglobin 15.1 (H) 12.0 - 15.0 g/dL   HCT 86.5 78.4 - 69.6 %   MCV 91.2 80.0 - 100.0 fL   MCH 30.1 26.0 - 34.0 pg   MCHC 33.0 30.0 - 36.0 g/dL   RDW 29.5 28.4 - 13.2 %   Platelets 238 150 - 400 K/uL   nRBC 0.0 0.0 - 0.2 %  Urinalysis, Routine w reflex microscopic -Urine, Clean Catch     Status: Abnormal   Collection Time: 04/17/23  7:58 PM  Result Value Ref Range   Color, Urine YELLOW YELLOW   APPearance CLEAR CLEAR   Specific Gravity, Urine 1.034 (H) 1.005 - 1.030   pH 6.5 5.0 - 8.0   Glucose, UA NEGATIVE NEGATIVE mg/dL   Hgb urine dipstick SMALL (A) NEGATIVE   Bilirubin Urine NEGATIVE NEGATIVE   Ketones, ur TRACE (A) NEGATIVE mg/dL   Protein, ur 30 (A) NEGATIVE mg/dL   Nitrite NEGATIVE NEGATIVE   Leukocytes,Ua SMALL (A) NEGATIVE   RBC / HPF 21-50 0 - 5 RBC/hpf   WBC, UA 0-5 0 - 5 WBC/hpf   Bacteria, UA NONE SEEN NONE SEEN   Squamous Epithelial / HPF 0-5 0 - 5 /HPF   Mucus PRESENT   Comprehensive metabolic panel     Status: Abnormal   Collection Time: 04/17/23  9:59 PM  Result Value Ref Range   Sodium 138 135 - 145 mmol/L   Potassium 3.4 (L) 3.5 - 5.1 mmol/L   Chloride 100 98 - 111 mmol/L   CO2 25 22 - 32 mmol/L   Glucose, Bld 141 (H) 70 - 99 mg/dL   BUN 18 8 - 23 mg/dL   Creatinine, Ser 4.40 0.44 - 1.00 mg/dL   Calcium 9.8 8.9 - 10.2 mg/dL   Total Protein 8.2 (H) 6.5 - 8.1 g/dL   Albumin 4.3 3.5 -  5.0 g/dL   AST 27 15 - 41 U/L   ALT 15 0 - 44 U/L   Alkaline Phosphatase 77 38 - 126 U/L   Total Bilirubin 0.5 0.3 - 1.2 mg/dL   GFR, Estimated >72 >53 mL/min   Anion gap 13 5 - 15  Lipase, blood     Status: None   Collection Time: 04/17/23  9:59 PM  Result Value Ref Range   Lipase 43 11 - 51 U/L    Imaging Studies: CT ABDOMEN PELVIS W CONTRAST  Result Date: 04/18/2023 CLINICAL DATA:  Abdominal pain. EXAM: CT ABDOMEN AND PELVIS WITH CONTRAST TECHNIQUE: Multidetector CT imaging of the abdomen and pelvis was performed using the standard protocol following bolus administration of intravenous contrast. RADIATION DOSE REDUCTION: This exam was performed according to the departmental dose-optimization program which includes automated exposure control, adjustment of the mA and/or kV according to  patient size and/or use of iterative reconstruction technique. CONTRAST:  80mL OMNIPAQUE IOHEXOL 300 MG/ML  SOLN COMPARISON:  None Available. FINDINGS: Lower chest: The visualized lung bases are clear. No intra-abdominal free air. Small free fluid in the abdomen pelvis. Hepatobiliary: The liver is unremarkable. No biliary dilatation. The gallbladder is unremarkable. Pancreas: Unremarkable. No pancreatic ductal dilatation or surrounding inflammatory changes. Spleen: Normal in size without focal abnormality. Adrenals/Urinary Tract: The adrenal glands unremarkable. There is no hydronephrosis on either side. There is symmetric enhancement and excretion of contrast by both kidneys. The visualized ureters and urinary bladder appear unremarkable. Stomach/Bowel: Small hiatal hernia. Multiple top normal caliber fluid-filled loops of small bowel measure up to 2.8 cm in caliber. There is stranding and edema in the associated mesentery. The terminal ileum and a loop of distal small bowel in the pelvis are collapsed. Findings favored to represent enteritis with associated ileus. Developing obstruction with transition in distal  ileum in the right hemipelvis is not excluded. Clinical correlation is recommended. A small-bowel series may provide better evaluation. There is sigmoid diverticulosis without active inflammatory changes. The appendix is normal. Vascular/Lymphatic: Mild aortoiliac atherosclerotic disease. The IVC is unremarkable. No portal venous gas. There is no adenopathy. Reproductive: Hysterectomy.  No adnexal masses. Other: None Musculoskeletal: Degenerative changes of the spine. No acute osseous pathology. IMPRESSION: 1. Findings favored to represent enteritis with associated ileus. Developing obstruction is not excluded. Small-bowel series may provide better evaluation. 2. Sigmoid diverticulosis. Normal appendix. 3.  Aortic Atherosclerosis (ICD10-I70.0). Electronically Signed   By: Elgie Collard M.D.   On: 04/18/2023 00:13    12:42 AM The radiologist is equivocal about the interpretation of the CT scan but to my eye it is more concerning for a small bowel obstruction.  I do not believe the patient would benefit from being discharged home in this condition and should be observed in the inpatient setting with possible surgical consultation.  Dr. Joneen Roach has accepted the patient for admission to the hospitalist service.   Jaccob Czaplicki, Jonny Ruiz, MD 04/18/23 [phone]

## 2023-04-18 NOTE — ED Notes (Signed)
Beth Stone at [facility] called for tranport. Bed Ready [facility] 3E Room# 1313.-ABB(NS)

## 2023-04-19 ENCOUNTER — Inpatient Hospital Stay (HOSPITAL_COMMUNITY): Payer: Medicare HMO

## 2023-04-19 DIAGNOSIS — K56609 Unspecified intestinal obstruction, unspecified as to partial versus complete obstruction: Secondary | ICD-10-CM | POA: Diagnosis not present

## 2023-04-19 MED ORDER — ASPIRIN 81 MG PO TBEC
81.0000 mg | DELAYED_RELEASE_TABLET | Freq: Every day | ORAL | Status: DC
  Administered 2023-04-19: 81 mg via ORAL
  Filled 2023-04-19: qty 1

## 2023-04-19 NOTE — Progress Notes (Signed)
Subjective: CC: Patient with bloating/distension, central abdominal pain, nausea and vomiting x 1 after cld yesterday. Still feels bloated with nausea and central abdominal pain this am but no further vomiting. Some hiccups. Passing flatus. No bm. Xray w/ contrast in proximal stomach with dilated small bowel loops and no contrast in colon.   Objective: Vital signs in last 24 hours: Temp:  [97.8 F (36.6 C)-98.9 F (37.2 C)] 98.5 F (36.9 C) (08/01 0934) Pulse Rate:  [69-105] 94 (08/01 0934) Resp:  [16-18] 17 (08/01 0934) BP: (134-159)/(85-106) 149/106 (08/01 0934) SpO2:  [91 %-98 %] 91 % (08/01 0934) Last BM Date :  (pt states "I cant begin to tell you how long its been")  Intake/Output from previous day: 07/31 0701 - 08/01 0700 In: 1037.6 [P.O.:360; I.V.:677.6] Out: 1250 [Urine:800; Emesis/NG output:450] Intake/Output this shift: Total I/O In: 180 [P.O.:180] Out: -   PE: Gen:  Alert, NAD, pleasant Abd: Soft, mild/mod distension with central abdominal ttp without rigidity or guarding. Hypoactive BS.   Lab Results:  Recent Labs    04/17/23 1958 04/19/23 0418  WBC 13.0* 12.2*  HGB 15.1* 14.6  HCT 45.8 45.2  PLT 238 269   BMET Recent Labs    04/17/23 2159 04/19/23 0418  NA 138 142  K 3.4* 4.2  CL 100 106  CO2 25 27  GLUCOSE 141* 118*  BUN 18 21  CREATININE 0.82 0.97  CALCIUM 9.8 9.0   PT/INR No results for input(s): "LABPROT", "INR" in the last 72 hours. CMP     Component Value Date/Time   NA 142 04/19/2023 0418   K 4.2 04/19/2023 0418   CL 106 04/19/2023 0418   CO2 27 04/19/2023 0418   GLUCOSE 118 (H) 04/19/2023 0418   BUN 21 04/19/2023 0418   CREATININE 0.97 04/19/2023 0418   CALCIUM 9.0 04/19/2023 0418   PROT 8.2 (H) 04/17/2023 2159   ALBUMIN 4.3 04/17/2023 2159   AST 27 04/17/2023 2159   ALT 15 04/17/2023 2159   ALKPHOS 77 04/17/2023 2159   BILITOT 0.5 04/17/2023 2159   GFRNONAA >60 04/19/2023 0418   GFRAA >60 03/20/2020 0514    Lipase     Component Value Date/Time   LIPASE 43 04/17/2023 2159    Studies/Results: DG Abd Portable 1V-Small Bowel Obstruction Protocol-initial, 8 hr delay  Result Date: 04/19/2023 CLINICAL DATA:  Small-bowel obstruction 8 hour delay EXAM: PORTABLE ABDOMEN - 1 VIEW COMPARISON:  CT 04/17/2023 FINDINGS: Large contrast collection in the proximal stomach. Multiple dilated loops of small bowel up to 3.6 cm. Possible dilute contrast within dilated small bowel. No contrast in the colon. IMPRESSION: Large contrast collection in the proximal stomach. Multiple dilated loops of small bowel with possible dilute contrast within dilated small bowel. No contrast in the colon. Findings suspicious for small bowel obstruction Electronically Signed   By: Jasmine Pang M.D.   On: 04/19/2023 01:04   CT ABDOMEN PELVIS W CONTRAST  Result Date: 04/18/2023 CLINICAL DATA:  Abdominal pain. EXAM: CT ABDOMEN AND PELVIS WITH CONTRAST TECHNIQUE: Multidetector CT imaging of the abdomen and pelvis was performed using the standard protocol following bolus administration of intravenous contrast. RADIATION DOSE REDUCTION: This exam was performed according to the departmental dose-optimization program which includes automated exposure control, adjustment of the mA and/or kV according to patient size and/or use of iterative reconstruction technique. CONTRAST:  80mL OMNIPAQUE IOHEXOL 300 MG/ML  SOLN COMPARISON:  None Available. FINDINGS: Lower chest: The visualized lung bases are  clear. No intra-abdominal free air. Small free fluid in the abdomen pelvis. Hepatobiliary: The liver is unremarkable. No biliary dilatation. The gallbladder is unremarkable. Pancreas: Unremarkable. No pancreatic ductal dilatation or surrounding inflammatory changes. Spleen: Normal in size without focal abnormality. Adrenals/Urinary Tract: The adrenal glands unremarkable. There is no hydronephrosis on either side. There is symmetric enhancement and excretion of  contrast by both kidneys. The visualized ureters and urinary bladder appear unremarkable. Stomach/Bowel: Small hiatal hernia. Multiple top normal caliber fluid-filled loops of small bowel measure up to 2.8 cm in caliber. There is stranding and edema in the associated mesentery. The terminal ileum and a loop of distal small bowel in the pelvis are collapsed. Findings favored to represent enteritis with associated ileus. Developing obstruction with transition in distal ileum in the right hemipelvis is not excluded. Clinical correlation is recommended. A small-bowel series may provide better evaluation. There is sigmoid diverticulosis without active inflammatory changes. The appendix is normal. Vascular/Lymphatic: Mild aortoiliac atherosclerotic disease. The IVC is unremarkable. No portal venous gas. There is no adenopathy. Reproductive: Hysterectomy.  No adnexal masses. Other: None Musculoskeletal: Degenerative changes of the spine. No acute osseous pathology. IMPRESSION: 1. Findings favored to represent enteritis with associated ileus. Developing obstruction is not excluded. Small-bowel series may provide better evaluation. 2. Sigmoid diverticulosis. Normal appendix. 3.  Aortic Atherosclerosis (ICD10-I70.0). Electronically Signed   By: Elgie Collard M.D.   On: 04/18/2023 00:13    Anti-infectives: Anti-infectives (From admission, onward)    None        Assessment/Plan SBO - CT w/ dilated small bowel loops with decompressed distal small bowel loops. There is some stranding and edema in the mesentery.  - HDS without fever, tachycardia or hypotension. No peritonitis on exam. WBC downtrending. No current indication for emergency surgery - Recommend NGT placement.  - AM xray.  - Keep K > 4, Mg > 2 and mobilize as able for bowel function (okay to clamp NGT for mobilization).  - Hopefully patient will improve with conservative management. If patient fails to improve with conservative management, they may  require exploratory surgery during admission - Agree with medical admission. We will follow with you.    FEN - NPO, NGT to LIWS, IVF per [facility] VTE - SCDs, Lovenox ID - None   I reviewed nursing notes, last 24 h vitals and pain scores, last 48 h intake and output, last 24 h labs and trends, and last 24 h imaging results.   LOS: 1 day    Jacinto Halim , [facility] 04/19/2023, 10:50 AM Please see Amion for pager number during day hours 7:00am-4:30pm

## 2023-04-19 NOTE — Progress Notes (Signed)
PROGRESS NOTE Beth Stone  WUJ:[id number] DOB: July 05, 1951 DOA: 04/17/2023 PCP: Ihor Gully, MD  Brief Narrative/Hospital Course: 47 yof w/ medical history significant for hypertension, prior hysterectomy and inguinal hernia repair x 2 being admitted to the hospital with concern for partial bowel obstruction. She started having diffuse lower abdominal pain without radiation, and associated nausea and vomiting on 04/17/23-she vomited a total of 3 times,including once in the ER Lab work was relatively unremarkable, as were her vital signs.  CT abdomen/abdomen-concerning for possible partial small bowel obstruction.  Patient was admitted for further management General surgery was consulted.    Subjective: Seen this am C/o dry mouth,  Passed flatus last night and this am, no BM, no nauseas or vomiting today but has abdomeen pain Past 24 hours afebrile BP maintained 130s to 150s systolic, saturating well on room air Labs reviewed stable BMP, mild leukocytosis at 12.2 downtrending  Assessment and Plan: Principal Problem:   SBO (small bowel obstruction) (HCC) Active Problems:   Enteritis   SBO: Prior history of hysterectomy and bilateral inguinal hernia repair, last colonoscopy IN 2020.  Xray thsi am ": Large contrast collection in the proximal stomach. Multiple dilated loops of small bowel with possible dilute contrast within dilated small bowel. No contrast in the colon. Findings suspicious for small bowel obstruction " Appreciate general surgery follow-up management with SBO protocol> planning for NGT decompression 8/1, keep on IVF.  Monitor electrolytes.  Essential hypertension: BP fairly controlled. Add iv Prn if needed  Mild leukocytosis: ?  Reactive, patient is afebrile, monitor  Mild hypokalemia: Resolved  DVT prophylaxis: enoxaparin (LOVENOX) injection 40 mg Start: 04/18/23 2200 SCDs Start: 04/18/23 1017 Code Status:   Code Status: Full Code Family Communication:  plan of care discussed with patient/daughter at bedside. Patient status is: Inpatient because of pSBO Level of care: Med-Surg   Dispo: The patient is from: home            Anticipated disposition: TBD Objective: Vitals last 24 hrs: Vitals:   04/18/23 1717 04/18/23 2206 04/19/23 0557 04/19/23 0934  BP: (!) 159/90 (!) 157/96 (!) 155/102 (!) 149/106  Pulse: 69 83 (!) 105 94  Resp: 16 18 18 17   Temp: 98.4 F (36.9 C) 97.8 F (36.6 C) 98.3 F (36.8 C) 98.5 F (36.9 C)  TempSrc: Oral Oral Oral Oral  SpO2: 93% 98% 92% 91%   Weight change:   Physical Examination: General exam: alert awake, older than stated age HEENT:Oral mucosa moist, Ear/Nose WNL grossly Respiratory system: bilaterally CLEAR BS, no use of accessory muscle Cardiovascular system: S1 & S2 +, No JVD. Gastrointestinal system: Abdomen soft, mildly distended, generalized tenderness,BS - Nervous System:Alert, awake, moving extremities. Extremities: LE edema NEG,distal peripheral pulses palpable.  Skin: No rashes,no icterus. MSK: Normal muscle bulk,tone, power  Medications reviewed:  Scheduled Meds:  aspirin EC  81 mg Oral Daily   enoxaparin (LOVENOX) injection  40 mg Subcutaneous Q24H   Continuous Infusions:  lactated ringers 1,000 mL with potassium chloride 20 mEq infusion 125 mL/hr at 04/19/23 9562     Diet Order             Diet clear liquid Fluid consistency: Thin  Diet effective now                   Intake/Output Summary (Last 24 hours) at 04/19/2023 1003 Last data filed at 04/19/2023 0937 Gross per 24 hour  Intake 1217.64 ml  Output 1250 ml  Net -32.36 ml  Net IO Since Admission: -32.36 mL [04/19/23 1003]  Wt Readings from Last 3 Encounters:  03/20/20 79.7 kg  03/10/19 79.8 kg  02/24/19 80.1 kg     Unresulted Labs (From admission, onward)     Start     Ordered   04/20/23 0500  Basic metabolic panel  Daily,   R      04/19/23 0745   04/20/23 0500  CBC  Daily,   R      04/19/23 0745    04/20/23 0500  Magnesium  Tomorrow morning,   R        04/19/23 0745          Data Reviewed: I have personally reviewed following labs and imaging studies CBC: Recent Labs  Lab 04/17/23 1958 04/19/23 0418  WBC 13.0* 12.2*  HGB 15.1* 14.6  HCT 45.8 45.2  MCV 91.2 92.8  PLT 238 269   Basic Metabolic Panel: Recent Labs  Lab 04/17/23 2159 04/19/23 0418  NA 138 142  K 3.4* 4.2  CL 100 106  CO2 25 27  GLUCOSE 141* 118*  BUN 18 21  CREATININE 0.82 0.97  CALCIUM 9.8 9.0   GFR: CrCl cannot be calculated (Unknown ideal weight.). Liver Function Tests: Recent Labs  Lab 04/17/23 2159  AST 27  ALT 15  ALKPHOS 77  BILITOT 0.5  PROT 8.2*  ALBUMIN 4.3   Recent Labs  Lab 04/17/23 2159  LIPASE 43  No results found for this or any previous visit (from the past 240 hour(s)).  Antimicrobials: Anti-infectives (From admission, onward)    None      Culture/Microbiology No results found for: "SDES", "SPECREQUEST", "CULT", "REPTSTATUS"  Other culture-see note  Radiology Studies: DG Abd Portable 1V-Small Bowel Obstruction Protocol-initial, 8 hr delay  Result Date: 04/19/2023 CLINICAL DATA:  Small-bowel obstruction 8 hour delay EXAM: PORTABLE ABDOMEN - 1 VIEW COMPARISON:  CT 04/17/2023 FINDINGS: Large contrast collection in the proximal stomach. Multiple dilated loops of small bowel up to 3.6 cm. Possible dilute contrast within dilated small bowel. No contrast in the colon. IMPRESSION: Large contrast collection in the proximal stomach. Multiple dilated loops of small bowel with possible dilute contrast within dilated small bowel. No contrast in the colon. Findings suspicious for small bowel obstruction Electronically Signed   By: Jasmine Pang M.D.   On: 04/19/2023 01:04   CT ABDOMEN PELVIS W CONTRAST  Result Date: 04/18/2023 CLINICAL DATA:  Abdominal pain. EXAM: CT ABDOMEN AND PELVIS WITH CONTRAST TECHNIQUE: Multidetector CT imaging of the abdomen and pelvis was performed using  the standard protocol following bolus administration of intravenous contrast. RADIATION DOSE REDUCTION: This exam was performed according to the departmental dose-optimization program which includes automated exposure control, adjustment of the mA and/or kV according to patient size and/or use of iterative reconstruction technique. CONTRAST:  80mL OMNIPAQUE IOHEXOL 300 MG/ML  SOLN COMPARISON:  None Available. FINDINGS: Lower chest: The visualized lung bases are clear. No intra-abdominal free air. Small free fluid in the abdomen pelvis. Hepatobiliary: The liver is unremarkable. No biliary dilatation. The gallbladder is unremarkable. Pancreas: Unremarkable. No pancreatic ductal dilatation or surrounding inflammatory changes. Spleen: Normal in size without focal abnormality. Adrenals/Urinary Tract: The adrenal glands unremarkable. There is no hydronephrosis on either side. There is symmetric enhancement and excretion of contrast by both kidneys. The visualized ureters and urinary bladder appear unremarkable. Stomach/Bowel: Small hiatal hernia. Multiple top normal caliber fluid-filled loops of small bowel measure up to 2.8 cm in caliber. There is stranding and  edema in the associated mesentery. The terminal ileum and a loop of distal small bowel in the pelvis are collapsed. Findings favored to represent enteritis with associated ileus. Developing obstruction with transition in distal ileum in the right hemipelvis is not excluded. Clinical correlation is recommended. A small-bowel series may provide better evaluation. There is sigmoid diverticulosis without active inflammatory changes. The appendix is normal. Vascular/Lymphatic: Mild aortoiliac atherosclerotic disease. The IVC is unremarkable. No portal venous gas. There is no adenopathy. Reproductive: Hysterectomy.  No adnexal masses. Other: None Musculoskeletal: Degenerative changes of the spine. No acute osseous pathology. IMPRESSION: 1. Findings favored to represent  enteritis with associated ileus. Developing obstruction is not excluded. Small-bowel series may provide better evaluation. 2. Sigmoid diverticulosis. Normal appendix. 3.  Aortic Atherosclerosis (ICD10-I70.0). Electronically Signed   By: Elgie Collard M.D.   On: 04/18/2023 00:13     LOS: 1 day   Lanae Boast, MD [facility]  04/19/2023, 10:03 AM

## 2023-04-19 NOTE — Hospital Course (Addendum)
59 yof w/ medical history significant for hypertension, prior hysterectomy and inguinal hernia repair x 2 being admitted to the hospital with concern for partial bowel obstruction. She started having diffuse lower abdominal pain without radiation, and associated nausea and vomiting on 04/17/23-she vomited a total of 3 times,including once in the ER Lab work was relatively unremarkable, as were her vital signs.  CT abdomen/abdomen-concerning for possible partial small bowel obstruction.  Patient was admitted for further management General surgery was consulted.

## 2023-04-20 ENCOUNTER — Inpatient Hospital Stay (HOSPITAL_COMMUNITY): Payer: Medicare HMO

## 2023-04-20 DIAGNOSIS — K56609 Unspecified intestinal obstruction, unspecified as to partial versus complete obstruction: Secondary | ICD-10-CM | POA: Diagnosis not present

## 2023-04-20 MED ORDER — BISACODYL 10 MG RE SUPP
10.0000 mg | Freq: Once | RECTAL | Status: AC
  Administered 2023-04-20: 10 mg via RECTAL
  Filled 2023-04-20: qty 1

## 2023-04-20 NOTE — Progress Notes (Addendum)
Subjective: CC: Says she is feeling better today but still with bloating/distension and some central abdominal pain. Required morphine x 5 yesterday and x 2 since midnight. Flatus x 3 yesterday. No BM. NGT with 2.5L out since placement - bilious. NGT does not obviously look to be in the duodenum on xray this am. Xray with dilated small bowel loops.  Afebrile without tachycardia or hypotension.   Objective: Vital signs in last 24 hours: Temp:  [98.2 F (36.8 C)-98.3 F (36.8 C)] 98.2 F (36.8 C) (08/02 0527) Pulse Rate:  [67] 67 (08/02 0527) Resp:  [16-18] 18 (08/02 0527) BP: (159-168)/(96-100) 159/100 (08/02 0527) SpO2:  [94 %-95 %] 95 % (08/02 0527) Weight:  [81 kg] 81 kg (08/02 0507) Last BM Date :  (pt states "I cant begin to tell you how long its been")  Intake/Output from previous day: 08/01 0701 - 08/02 0700 In: 4076.2 [P.O.:480; I.V.:3596.2] Out: 3200 [Urine:700; Emesis/NG output:2500] Intake/Output this shift: Total I/O In: 0  Out: 250 [Urine:200; Emesis/NG output:50]  PE: Gen:  Alert, NAD, pleasant Abd: Soft, mod distension with central abdominal ttp without rigidity or guarding. Hypoactive BS. NGT bilious.   Lab Results:  Recent Labs    04/19/23 0418 04/20/23 0429  WBC 12.2* 12.2*  HGB 14.6 14.2  HCT 45.2 43.8  PLT 269 256   BMET Recent Labs    04/19/23 0418 04/20/23 0429  NA 142 142  K 4.2 4.1  CL 106 105  CO2 27 29  GLUCOSE 118* 101*  BUN 21 19  CREATININE 0.97 0.87  CALCIUM 9.0 8.8*   PT/INR No results for input(s): "LABPROT", "INR" in the last 72 hours. CMP     Component Value Date/Time   NA 142 04/20/2023 0429   K 4.1 04/20/2023 0429   CL 105 04/20/2023 0429   CO2 29 04/20/2023 0429   GLUCOSE 101 (H) 04/20/2023 0429   BUN 19 04/20/2023 0429   CREATININE 0.87 04/20/2023 0429   CALCIUM 8.8 (L) 04/20/2023 0429   PROT 8.2 (H) 04/17/2023 2159   ALBUMIN 4.3 04/17/2023 2159   AST 27 04/17/2023 2159   ALT 15 04/17/2023 2159    ALKPHOS 77 04/17/2023 2159   BILITOT 0.5 04/17/2023 2159   GFRNONAA >60 04/20/2023 0429   GFRAA >60 03/20/2020 0514   Lipase     Component Value Date/Time   LIPASE 43 04/17/2023 2159    Studies/Results: DG Abd Portable 1V  Result Date: 04/20/2023 CLINICAL DATA:  Small bowel obstruction. EXAM: PORTABLE ABDOMEN - 1 VIEW COMPARISON:  04/19/2023 FINDINGS: There is an enteric tube with tip and side port within the stomach. Dilated small bowel loops within the central abdomen are again noted and appear unchanged. Paucity of colonic gas. No new findings. IMPRESSION: 1. Enteric tube tip and side port within the stomach. 2. Unchanged dilated small bowel loops within the central abdomen. Electronically Signed   By: Signa Kell M.D.   On: 04/20/2023 08:16   DG Abd Portable 1V  Result Date: 04/19/2023 CLINICAL DATA:  [id number] Encounter for imaging study to confirm nasogastric (NG) tube placement [id number] EXAM: PORTABLE ABDOMEN - 1 VIEW COMPARISON:  April 18, 2023, April 17, 2023 FINDINGS: Incomplete visualization of the pelvis. Enteric tube tip and side port project over the stomach. Mildly dilated loops of small bowel in the mid abdomen measuring approximately 3.6 cm. This is similar in comparison to prior CT. Enteric contrast delineates the stomach and several loops of small bowel.  IMPRESSION: Enteric tube tip and side port project over the stomach. Similar dilation of small bowel likely reflecting obstruction. Electronically Signed   By: Meda Klinefelter M.D.   On: 04/19/2023 14:51   DG Abd Portable 1V-Small Bowel Obstruction Protocol-initial, 8 hr delay  Result Date: 04/19/2023 CLINICAL DATA:  Small-bowel obstruction 8 hour delay EXAM: PORTABLE ABDOMEN - 1 VIEW COMPARISON:  CT 04/17/2023 FINDINGS: Large contrast collection in the proximal stomach. Multiple dilated loops of small bowel up to 3.6 cm. Possible dilute contrast within dilated small bowel. No contrast in the colon. IMPRESSION: Large contrast  collection in the proximal stomach. Multiple dilated loops of small bowel with possible dilute contrast within dilated small bowel. No contrast in the colon. Findings suspicious for small bowel obstruction Electronically Signed   By: Jasmine Pang M.D.   On: 04/19/2023 01:04    Anti-infectives: Anti-infectives (From admission, onward)    None        Assessment/Plan SBO - CT w/ dilated small bowel loops with decompressed distal small bowel loops. There is some stranding and edema in the mesentery.  - HDS without fever, tachycardia or hypotension. No peritonitis on exam. WBC stable at 12.2. No current indication for emergency surgery - Cont NGT to LIWS - AM xray.  - Keep K > 4, Mg > 2 and mobilize as able for bowel function (okay to clamp NGT for mobilization).  - Hopefully patient will improve with conservative management. If patient fails to improve with conservative management, they may require exploratory surgery during admission. Discussed with her this may be in the next 24-48 hours if she is not showing improvement.  - We will follow with you.    FEN - NPO, NGT to LIWS, IVF per [facility] VTE - SCDs, Lovenox ID - None   I reviewed nursing notes, last 24 h vitals and pain scores, last 48 h intake and output, last 24 h labs and trends, and last 24 h imaging results.   LOS: 2 days    Jacinto Halim , [facility] 04/20/2023, 10:21 AM Please see Amion for pager number during day hours 7:00am-4:30pm

## 2023-04-20 NOTE — Plan of Care (Signed)
  Problem: Education: Goal: Knowledge of General Education information will improve Description: Including pain rating scale, medication(s)/side effects and non-pharmacologic comfort measures Outcome: Progressing   Problem: Clinical Measurements: Goal: Will remain free from infection Outcome: Progressing   Problem: Activity: Goal: Risk for activity intolerance will decrease Outcome: Progressing   Problem: Coping: Goal: Level of anxiety will decrease Outcome: Progressing   Problem: Elimination: Goal: Will not experience complications related to urinary retention Outcome: Progressing

## 2023-04-20 NOTE — Progress Notes (Signed)
PROGRESS NOTE Beth Stone  NWG:[id number] DOB: 1951/04/28 DOA: 04/17/2023 PCP: Ihor Gully, MD  Brief Narrative/Hospital Course: 89 yof w/ medical history significant for hypertension, prior hysterectomy and inguinal hernia repair x 2 being admitted to the hospital with concern for partial bowel obstruction. She started having diffuse lower abdominal pain without radiation, and associated nausea and vomiting on 04/17/23-she vomited a total of 3 times,including once in the ER Lab work was relatively unremarkable, as were her vital signs.  CT abdomen/abdomen-concerning for possible partial small bowel obstruction.  Patient was admitted for further management General surgery was consulted.    Subjective: Patient seen and examined this morning Reports she is passing flatus she feels much better with NG tube decompression Family numbers at the bedside Past 24 hours afebrile,  NG output 2.5 L past 24 hours  Labs pending this morning   Assessment and Plan: Principal Problem:   SBO (small bowel obstruction) (HCC) Active Problems:   Enteritis   SBO: Prior history of hysterectomy and bilateral inguinal hernia repair, last colonoscopy IN 2020.  X-ray 8/1 showed no contrast in the colon, NG tube inserted > had 2.5 L NG output past 24 hours.  Appreciate general surgery input, SBO protocol, x-ray this morning with unchanged dilated small bowel loops within the central abdomen.  But clinically she reports she feels better with NG tube decompression, continue n.p.o. IV fluid hydration monitor electrolytes.  If fails  to improve may need ex laparotomy over the weekend  Essential hypertension: BP fairly controlled.monitor  Mild leukocytosis: ?  Reactive, patient is afebrile, monitor  Mild hypokalemia: Resolved  DVT prophylaxis: enoxaparin (LOVENOX) injection 40 mg Start: 04/18/23 2200 SCDs Start: 04/18/23 1017 Code Status:   Code Status: Full Code Family Communication: plan of care  discussed with patient/ updated daughter at bedside. Patient status is: Inpatient because of pSBO Level of care: Med-Surg   Dispo: The patient is from: home            Anticipated disposition: TBD Objective: Vitals last 24 hrs: Vitals:   04/19/23 0934 04/19/23 1433 04/20/23 0507 04/20/23 0527  BP: (!) 149/106 (!) 168/96  (!) 159/100  Pulse: 94 67  67  Resp: 17 16  18   Temp: 98.5 F (36.9 C) 98.3 F (36.8 C)  98.2 F (36.8 C)  TempSrc: Oral Oral  Oral  SpO2: 91% 94%  95%  Weight:   81 kg   Height:   5\' 7"  (1.702 m)    Weight change:   Physical Examination: General exam: alert awake, oriented x3, NGT+ w/ brownish fluid HEENT:Oral mucosa moist, Ear/Nose WNL grossly Respiratory system: Bilaterally clear BS,no use of accessory muscle Cardiovascular system: S1 & S2 +, No JVD. Gastrointestinal system: Abdomen soft, mild generalized tenderness present ND, BS sluggish Nervous System: Alert, awake, moving all extremities,and following commands. Extremities: LE edema neg,distal peripheral pulses palpable and warm.  Skin: No rashes,no icterus. MSK: Normal muscle bulk,tone, power   Medications reviewed:  Scheduled Meds:  enoxaparin (LOVENOX) injection  40 mg Subcutaneous Q24H   Continuous Infusions:  lactated ringers 1,000 mL with potassium chloride 20 mEq infusion 125 mL/hr at 04/20/23 5784     Diet Order             Diet NPO time specified Except for: Sips with Meds  Diet effective midnight                   Intake/Output Summary (Last 24 hours) at 04/20/2023 6962 Last data  filed at 04/20/2023 0830 Gross per 24 hour  Intake 3956.24 ml  Output 3450 ml  Net 506.24 ml   Net IO Since Admission: 413.88 mL [04/20/23 0924]  Wt Readings from Last 3 Encounters:  04/20/23 81 kg  03/20/20 79.7 kg  03/10/19 79.8 kg     Unresulted Labs (From admission, onward)     Start     Ordered   04/20/23 0500  Basic metabolic panel  Daily,   R      04/19/23 0745   04/20/23 0500  CBC   Daily,   R      04/19/23 0745          Data Reviewed: I have personally reviewed following labs and imaging studies CBC: Recent Labs  Lab 04/17/23 1958 04/19/23 0418 04/20/23 0429  WBC 13.0* 12.2* 12.2*  HGB 15.1* 14.6 14.2  HCT 45.8 45.2 43.8  MCV 91.2 92.8 93.6  PLT 238 269 256   Basic Metabolic Panel: Recent Labs  Lab 04/17/23 2159 04/19/23 0418 04/20/23 0429  NA 138 142 142  K 3.4* 4.2 4.1  CL 100 106 105  CO2 25 27 29   GLUCOSE 141* 118* 101*  BUN 18 21 19   CREATININE 0.82 0.97 0.87  CALCIUM 9.8 9.0 8.8*  MG  --   --  2.0   GFR: Estimated Creatinine Clearance: 65 mL/min (by C-G formula based on SCr of 0.87 mg/dL). Liver Function Tests: Recent Labs  Lab 04/17/23 2159  AST 27  ALT 15  ALKPHOS 77  BILITOT 0.5  PROT 8.2*  ALBUMIN 4.3   Recent Labs  Lab 04/17/23 2159  LIPASE 43  No results found for this or any previous visit (from the past 240 hour(s)).  Antimicrobials: Anti-infectives (From admission, onward)    None      Culture/Microbiology No results found for: "SDES", "SPECREQUEST", "CULT", "REPTSTATUS"  Other culture-see note  Radiology Studies: DG Abd Portable 1V  Result Date: 04/20/2023 CLINICAL DATA:  Small bowel obstruction. EXAM: PORTABLE ABDOMEN - 1 VIEW COMPARISON:  04/19/2023 FINDINGS: There is an enteric tube with tip and side port within the stomach. Dilated small bowel loops within the central abdomen are again noted and appear unchanged. Paucity of colonic gas. No new findings. IMPRESSION: 1. Enteric tube tip and side port within the stomach. 2. Unchanged dilated small bowel loops within the central abdomen. Electronically Signed   By: Signa Kell M.D.   On: 04/20/2023 08:16   DG Abd Portable 1V  Result Date: 04/19/2023 CLINICAL DATA:  [id number] Encounter for imaging study to confirm nasogastric (NG) tube placement [id number] EXAM: PORTABLE ABDOMEN - 1 VIEW COMPARISON:  April 18, 2023, April 17, 2023 FINDINGS: Incomplete visualization  of the pelvis. Enteric tube tip and side port project over the stomach. Mildly dilated loops of small bowel in the mid abdomen measuring approximately 3.6 cm. This is similar in comparison to prior CT. Enteric contrast delineates the stomach and several loops of small bowel. IMPRESSION: Enteric tube tip and side port project over the stomach. Similar dilation of small bowel likely reflecting obstruction. Electronically Signed   By: Meda Klinefelter M.D.   On: 04/19/2023 14:51   DG Abd Portable 1V-Small Bowel Obstruction Protocol-initial, 8 hr delay  Result Date: 04/19/2023 CLINICAL DATA:  Small-bowel obstruction 8 hour delay EXAM: PORTABLE ABDOMEN - 1 VIEW COMPARISON:  CT 04/17/2023 FINDINGS: Large contrast collection in the proximal stomach. Multiple dilated loops of small bowel up to 3.6 cm. Possible dilute contrast within dilated  small bowel. No contrast in the colon. IMPRESSION: Large contrast collection in the proximal stomach. Multiple dilated loops of small bowel with possible dilute contrast within dilated small bowel. No contrast in the colon. Findings suspicious for small bowel obstruction Electronically Signed   By: Jasmine Pang M.D.   On: 04/19/2023 01:04     LOS: 2 days   Lanae Boast, MD [facility]  04/20/2023, 9:24 AM

## 2023-04-21 ENCOUNTER — Inpatient Hospital Stay (HOSPITAL_COMMUNITY): Payer: Medicare HMO

## 2023-04-21 DIAGNOSIS — K56609 Unspecified intestinal obstruction, unspecified as to partial versus complete obstruction: Secondary | ICD-10-CM | POA: Diagnosis not present

## 2023-04-21 NOTE — Plan of Care (Signed)

## 2023-04-21 NOTE — Progress Notes (Signed)
PROGRESS NOTE    Beth Stone  WUJ:[id number] DOB: 1951/04/22 DOA: 04/17/2023 PCP: Ihor Gully, MD  Chief Complaint  Patient presents with   Abdominal Pain   Constipation    Brief Narrative:   65 yof w/ medical history significant for hypertension, prior hysterectomy and inguinal hernia repair x 2 being admitted to the hospital with concern for partial bowel obstruction. She started having diffuse lower abdominal pain without radiation, and associated nausea and vomiting on 04/17/23-she vomited [facility] total of 3 times,including once in the ER Lab work was relatively unremarkable, as were her vital signs.  CT abdomen/abdomen-concerning for possible partial small bowel obstruction.  Patient was admitted for further management General surgery was consulted.   Assessment & Plan:   Principal Problem:   SBO (small bowel obstruction) (HCC) Active Problems:   Enteritis  SBO: Prior history of hysterectomy and bilateral inguinal hernia repair, last colonoscopy IN 2020.   X-ray 8/1 showed no contrast in the colon, NG tube inserted    8/3 plain films with persistent SBO pattern  Appreciate surgery recs - if fails to improve, may need exploratory surgery    Essential hypertension: BP fairly controlled.monitor   Mild leukocytosis: ?  Reactive, patient is afebrile, monitor   Mild hypokalemia: Resolved    DVT prophylaxis: lovenox Code Status: full Family Communication: none Disposition:   Status is: Inpatient Remains inpatient appropriate because: need for inpatient care   Consultants:  surgery  Procedures:  none  Antimicrobials:  Anti-infectives (From admission, onward)    None       Subjective: No complaints  Objective: Vitals:   04/20/23 1644 04/20/23 2141 04/21/23 0644 04/21/23 1243  BP: (!) 149/99 (!) 159/102 (!) 155/100 (!) 154/99  Pulse: 86 97 65 70  Resp:  17 16   Temp: 97.8 F (36.6 C) 97.8 F (36.6 C) 98.2 F (36.8 C) 97.9 F (36.6 C)   TempSrc:  Oral  Oral  SpO2: 96% 96% 99% 99%  Weight:      Height:        Intake/Output Summary (Last 24 hours) at 04/21/2023 1504 Last data filed at 04/21/2023 1331 Gross per 24 hour  Intake 2814.65 ml  Output 2901 ml  Net -86.35 ml   Filed Weights   04/20/23 0507  Weight: 81 kg    Examination:  General exam: Appears calm and comfortable  Respiratory system: unlabored Cardiovascular system: RRR Gastrointestinal system: mildly tender, mildly distended Central nervous system: Alert and oriented. No focal neurological deficits. Extremities: no LEE    Data Reviewed: I have personally reviewed following labs and imaging studies  CBC: Recent Labs  Lab 04/17/23 1958 04/19/23 0418 04/20/23 0429 04/21/23 0548  WBC 13.0* 12.2* 12.2* 10.7*  HGB 15.1* 14.6 14.2 14.6  HCT 45.8 45.2 43.8 45.9  MCV 91.2 92.8 93.6 93.5  PLT 238 269 256 243    Basic Metabolic Panel: Recent Labs  Lab 04/17/23 2159 04/19/23 0418 04/20/23 0429 04/21/23 0548  NA 138 142 142 139  K 3.4* 4.2 4.1 3.7  CL 100 106 105 102  CO2 25 27 29 27   GLUCOSE 141* 118* 101* 91  BUN 18 21 19 18   CREATININE 0.82 0.97 0.87 0.91  CALCIUM 9.8 9.0 8.8* 8.8*  MG  --   --  2.0  --     GFR: Estimated Creatinine Clearance: 62.1 mL/min (by C-G formula based on SCr of 0.91 mg/dL).  Liver Function Tests: Recent Labs  Lab 04/17/23 2159  AST  27  ALT 15  ALKPHOS 77  BILITOT 0.5  PROT 8.2*  ALBUMIN 4.3    CBG: No results for input(s): "GLUCAP" in the last 168 hours.   No results found for this or any previous visit (from the past 240 hour(s)).       Radiology Studies: DG Abd Portable 1V  Result Date: 04/21/2023 CLINICAL DATA:  Follow-up small bowel obstruction. EXAM: PORTABLE ABDOMEN - 1 VIEW COMPARISON:  04/20/2023 FINDINGS: The nasogastric tube tip and side port are in the gastric fundus. Persistent dilated loops of small bowel are noted within the central abdomen measuring up to 4.4 cm. No new  findings. IMPRESSION: Persistent small bowel obstruction pattern. Electronically Signed   By: Signa Kell M.D.   On: 04/21/2023 09:23   DG Abd Portable 1V  Result Date: 04/20/2023 CLINICAL DATA:  Small bowel obstruction. EXAM: PORTABLE ABDOMEN - 1 VIEW COMPARISON:  04/19/2023 FINDINGS: There is an enteric tube with tip and side port within the stomach. Dilated small bowel loops within the central abdomen are again noted and appear unchanged. Paucity of colonic gas. No new findings. IMPRESSION: 1. Enteric tube tip and side port within the stomach. 2. Unchanged dilated small bowel loops within the central abdomen. Electronically Signed   By: Signa Kell M.D.   On: 04/20/2023 08:16        Scheduled Meds:  enoxaparin (LOVENOX) injection  40 mg Subcutaneous Q24H   Continuous Infusions:  lactated ringers 1,000 mL with potassium chloride 20 mEq infusion 125 mL/hr at 04/21/23 0930     LOS: 3 days    Time spent: over 30 min    Lacretia Nicks, MD [facility]   To contact the attending provider between 7A-7P or the covering provider during after hours 7P-7A, please log into the web site [email or web address] and access using universal [place] password for that web site. If you do not have the password, please call the hospital operator.  04/21/2023, 3:04 PM

## 2023-04-21 NOTE — Progress Notes (Signed)
Subjective: CC: Says she is feeling better today No BM with suppository yesterday. NGT with out- bilious.   Afebrile without tachycardia or hypotension.   Objective: Vital signs in last 24 hours: Temp:  [97.8 F (36.6 C)-98.5 F (36.9 C)] 98.2 F (36.8 C) (08/03 0644) Pulse Rate:  [65-97] 65 (08/03 0644) Resp:  [16-18] 16 (08/03 0644) BP: (149-176)/(99-102) 155/100 (08/03 0644) SpO2:  [95 %-99 %] 99 % (08/03 0644) Last BM Date :  (pt states "I cant begin to tell you how long its been")  Intake/Output from previous day: 08/02 0701 - 08/03 0700 In: 2814.7 [I.V.:2814.7] Out: 2602 [Urine:902; Emesis/NG output:1700] Intake/Output this shift: No intake/output data recorded.  PE: Gen:  Alert, NAD, pleasant Abd: Soft, mod distension without ttp without rigidity or guarding.   NGT bilious.   Lab Results:  Recent Labs    04/20/23 0429 04/21/23 0548  WBC 12.2* 10.7*  HGB 14.2 14.6  HCT 43.8 45.9  PLT 256 243   BMET Recent Labs    04/20/23 0429 04/21/23 0548  NA 142 139  K 4.1 3.7  CL 105 102  CO2 29 27  GLUCOSE 101* 91  BUN 19 18  CREATININE 0.87 0.91  CALCIUM 8.8* 8.8*   PT/INR No results for input(s): "LABPROT", "INR" in the last 72 hours. CMP     Component Value Date/Time   NA 139 04/21/2023 0548   K 3.7 04/21/2023 0548   CL 102 04/21/2023 0548   CO2 27 04/21/2023 0548   GLUCOSE 91 04/21/2023 0548   BUN 18 04/21/2023 0548   CREATININE 0.91 04/21/2023 0548   CALCIUM 8.8 (L) 04/21/2023 0548   PROT 8.2 (H) 04/17/2023 2159   ALBUMIN 4.3 04/17/2023 2159   AST 27 04/17/2023 2159   ALT 15 04/17/2023 2159   ALKPHOS 77 04/17/2023 2159   BILITOT 0.5 04/17/2023 2159   GFRNONAA >60 04/21/2023 0548   GFRAA >60 03/20/2020 0514   Lipase     Component Value Date/Time   LIPASE 43 04/17/2023 2159    Studies/Results: DG Abd Portable 1V  Result Date: 04/20/2023 CLINICAL DATA:  Small bowel obstruction. EXAM: PORTABLE ABDOMEN - 1 VIEW COMPARISON:   04/19/2023 FINDINGS: There is an enteric tube with tip and side port within the stomach. Dilated small bowel loops within the central abdomen are again noted and appear unchanged. Paucity of colonic gas. No new findings. IMPRESSION: 1. Enteric tube tip and side port within the stomach. 2. Unchanged dilated small bowel loops within the central abdomen. Electronically Signed   By: Signa Kell M.D.   On: 04/20/2023 08:16   DG Abd Portable 1V  Result Date: 04/19/2023 CLINICAL DATA:  [id number] Encounter for imaging study to confirm nasogastric (NG) tube placement [id number] EXAM: PORTABLE ABDOMEN - 1 VIEW COMPARISON:  April 18, 2023, April 17, 2023 FINDINGS: Incomplete visualization of the pelvis. Enteric tube tip and side port project over the stomach. Mildly dilated loops of small bowel in the mid abdomen measuring approximately 3.6 cm. This is similar in comparison to prior CT. Enteric contrast delineates the stomach and several loops of small bowel. IMPRESSION: Enteric tube tip and side port project over the stomach. Similar dilation of small bowel likely reflecting obstruction. Electronically Signed   By: Meda Klinefelter M.D.   On: 04/19/2023 14:51    Anti-infectives: Anti-infectives (From admission, onward)    None        Assessment/Plan SBO - CT w/ dilated small bowel  loops with decompressed distal small bowel loops. There is some stranding and edema in the mesentery.  - HDS without fever, tachycardia or hypotension. No peritonitis on exam. WBC stable at 12.2. No current indication for emergency surgery - Cont NGT to LIWS - AM xray.  - Keep K > 4, Mg > 2 and mobilize as able for bowel function (okay to clamp NGT for mobilization).  - Hopefully patient will improve with conservative management. If patient fails to improve with conservative management, they may require exploratory surgery during admission. Repeat AXR in AM.  Discussed with her this may be in the next 48 hours if she is not showing  improvement.  - We will follow with you.    FEN - NPO, NGT to LIWS, IVF per [facility] VTE - SCDs, Lovenox ID - None   I reviewed nursing notes, last 24 h vitals and pain scores, last 48 h intake and output, last 24 h labs and trends, and last 24 h imaging results.   LOS: 3 days    Vanita Panda , MD [facility] 04/21/2023, 8:52 AM Please see Amion for pager number during day hours 7:00am-4:30pm

## 2023-04-22 ENCOUNTER — Inpatient Hospital Stay (HOSPITAL_COMMUNITY): Payer: Medicare HMO

## 2023-04-22 DIAGNOSIS — K56609 Unspecified intestinal obstruction, unspecified as to partial versus complete obstruction: Secondary | ICD-10-CM | POA: Diagnosis not present

## 2023-04-22 LAB — BASIC METABOLIC PANEL WITH GFR
Anion gap: 11 (ref 5–15)
BUN: 18 mg/dL (ref 8–23)
CO2: 25 mmol/L (ref 22–32)
Calcium: 8.7 mg/dL — ABNORMAL LOW (ref 8.9–10.3)
Chloride: 101 mmol/L (ref 98–111)
Creatinine, Ser: 0.82 mg/dL (ref 0.44–1.00)
GFR, Estimated: >60 mL/min (ref >60)
Glucose, Bld: 92 mg/dL (ref 70–99)
Potassium: 3.6 mmol/L (ref 3.5–5.1)
Sodium: 137 mmol/L (ref 135–145)

## 2023-04-22 LAB — CBC
HCT: 40.6 % (ref 36.0–46.0)
HCT: 42.4 % (ref 36.0–46.0)
Hemoglobin: 13.2 g/dL (ref 12.0–15.0)
Hemoglobin: 13.6 g/dL (ref 12.0–15.0)
MCH: 29.7 pg (ref 26.0–34.0)
MCH: 29.5 pg (ref 26.0–34.0)
MCHC: 32.5 g/dL (ref 30.0–36.0)
MCHC: 32.1 g/dL (ref 30.0–36.0)
MCV: 91.2 fL (ref 80.0–100.0)
MCV: 92 fL (ref 80.0–100.0)
Platelets: 247 10*3/uL (ref 150–400)
Platelets: 242 10*3/uL (ref 150–400)
RBC: 4.45 MIL/uL (ref 3.87–5.11)
RBC: 4.61 MIL/uL (ref 3.87–5.11)
RDW: 12.3 % (ref 11.5–15.5)
RDW: 12.3 % (ref 11.5–15.5)
WBC: 11.9 10*3/uL — ABNORMAL HIGH (ref 4.0–10.5)
WBC: 11.9 10*3/uL — ABNORMAL HIGH (ref 4.0–10.5)
nRBC: 0 % (ref 0.0–0.2)
nRBC: 0 % (ref 0.0–0.2)

## 2023-04-22 LAB — PROCALCITONIN: Procalcitonin: <0.1 ng/mL

## 2023-04-22 LAB — COMPREHENSIVE METABOLIC PANEL
ALT: 22 U/L (ref 0–44)
AST: 33 U/L (ref 15–41)
Albumin: 3 g/dL — ABNORMAL LOW (ref 3.5–5.0)
Alkaline Phosphatase: 61 U/L (ref 38–126)
Anion gap: 10 (ref 5–15)
BUN: 20 mg/dL (ref 8–23)
CO2: 27 mmol/L (ref 22–32)
Calcium: 8.6 mg/dL — ABNORMAL LOW (ref 8.9–10.3)
Chloride: 101 mmol/L (ref 98–111)
Creatinine, Ser: 0.91 mg/dL (ref 0.44–1.00)
GFR, Estimated: >60 mL/min (ref >60)
Glucose, Bld: 91 mg/dL (ref 70–99)
Potassium: 4.5 mmol/L (ref 3.5–5.1)
Sodium: 138 mmol/L (ref 135–145)
Total Bilirubin: 1 mg/dL (ref 0.3–1.2)
Total Protein: 6.2 g/dL — ABNORMAL LOW (ref 6.5–8.1)

## 2023-04-22 LAB — SURGICAL PCR SCREEN
MRSA, PCR: NEGATIVE
Staphylococcus aureus: NEGATIVE

## 2023-04-22 LAB — LACTIC ACID, PLASMA
Lactic Acid, Venous: 1.1 mmol/L (ref 0.5–1.9)
Lactic Acid, Venous: 1.1 mmol/L (ref 0.5–1.9)

## 2023-04-22 MED ORDER — MORPHINE SULFATE (PF) 2 MG/ML IV SOLN
2.0000 mg | INTRAVENOUS | Status: DC | PRN
  Administered 2023-04-22 – 2023-04-23 (×7): 2 mg via INTRAVENOUS
  Filled 2023-04-22 (×7): qty 1

## 2023-04-22 MED ORDER — BISACODYL 10 MG RE SUPP
10.0000 mg | Freq: Once | RECTAL | Status: AC
  Administered 2023-04-22: 10 mg via RECTAL
  Filled 2023-04-22: qty 1

## 2023-04-22 NOTE — Plan of Care (Signed)
  Problem: Education: Goal: Knowledge of General Education information will improve Description: Including pain rating scale, medication(s)/side effects and non-pharmacologic comfort measures Outcome: Progressing   Problem: Activity: Goal: Risk for activity intolerance will decrease Outcome: Progressing   

## 2023-04-22 NOTE — Plan of Care (Signed)
  Problem: Education: Goal: Knowledge of General Education information will improve Description Including pain rating scale, medication(s)/side effects and non-pharmacologic comfort measures Outcome: Progressing   Problem: Health Behavior/Discharge Planning: Goal: Ability to manage health-related needs will improve Outcome: Progressing   

## 2023-04-22 NOTE — Progress Notes (Signed)
Subjective: CC: Says her pain is worse today  Objective: Vital signs in last 24 hours: Temp:  [97.9 F (36.6 C)-98.6 F (37 C)] 98.2 F (36.8 C) (08/04 0602) Pulse Rate:  [70-110] 110 (08/04 0602) Resp:  [12-14] 14 (08/04 0602) BP: (121-163)/(98-99) 121/98 (08/04 0602) SpO2:  [95 %-99 %] 95 % (08/04 0602) Last BM Date :  (pt states "I cant begin to tell you how long its been")  Intake/Output from previous day: 08/03 0701 - 08/04 0700 In: 2713 [I.V.:2713] Out: 2925 [Urine:1300; Emesis/NG output:1625] Intake/Output this shift: No intake/output data recorded.  PE: Gen:  Alert, NAD, pleasant Abd: Soft, mod distension without ttp without rigidity or guarding.   NGT bilious.   Lab Results:  Recent Labs    04/21/23 0548 04/22/23 0533  WBC 10.7* 11.9*  HGB 14.6 13.2  HCT 45.9 40.6  PLT 243 247   BMET Recent Labs    04/21/23 0548 04/22/23 0533  NA 139 137  K 3.7 3.6  CL 102 101  CO2 27 25  GLUCOSE 91 92  BUN 18 18  CREATININE 0.91 0.82  CALCIUM 8.8* 8.7*   PT/INR No results for input(s): "LABPROT", "INR" in the last 72 hours. CMP     Component Value Date/Time   NA 137 04/22/2023 0533   K 3.6 04/22/2023 0533   CL 101 04/22/2023 0533   CO2 25 04/22/2023 0533   GLUCOSE 92 04/22/2023 0533   BUN 18 04/22/2023 0533   CREATININE 0.82 04/22/2023 0533   CALCIUM 8.7 (L) 04/22/2023 0533   PROT 8.2 (H) 04/17/2023 2159   ALBUMIN 4.3 04/17/2023 2159   AST 27 04/17/2023 2159   ALT 15 04/17/2023 2159   ALKPHOS 77 04/17/2023 2159   BILITOT 0.5 04/17/2023 2159   GFRNONAA >60 04/22/2023 0533   GFRAA >60 03/20/2020 0514   Lipase     Component Value Date/Time   LIPASE 43 04/17/2023 2159    Studies/Results: DG Abd Portable 1V  Result Date: 04/21/2023 CLINICAL DATA:  Follow-up small bowel obstruction. EXAM: PORTABLE ABDOMEN - 1 VIEW COMPARISON:  04/20/2023 FINDINGS: The nasogastric tube tip and side port are in the gastric fundus. Persistent dilated loops  of small bowel are noted within the central abdomen measuring up to 4.4 cm. No new findings. IMPRESSION: Persistent small bowel obstruction pattern. Electronically Signed   By: Signa Kell M.D.   On: 04/21/2023 09:23    Anti-infectives: Anti-infectives (From admission, onward)    None        Assessment/Plan SBO - CT w/ dilated small bowel loops with decompressed distal small bowel loops. There is some stranding and edema in the mesentery.  - HDS without fever, tachycardia or hypotension. No peritonitis on exam. WBC stable at 12.2. No current indication for emergency surgery - Cont NGT to LIWS - AM xray.  - Keep K > 4, Mg > 2 and mobilize as able for bowel function (okay to clamp NGT for mobilization).  - Pt is failing to improve with conservative management  I believe she will require exploratory surgery tomorrow. Discussed with pt.  She does not want surgery but understands that it is probably neccessary - We will follow with you.    FEN - NPO, NGT to LIWS, IVF per [facility] VTE - SCDs, Lovenox ID - None   I reviewed nursing notes, last 24 h vitals and pain scores, last 48 h intake and output, last 24 h labs and trends, and last  24 h imaging results.   LOS: 4 days    Vanita Panda , MD [facility] 04/22/2023, 8:30 AM Please see Amion for pager number during day hours 7:00am-4:30pm

## 2023-04-22 NOTE — Progress Notes (Signed)
PROGRESS NOTE  Beth Stone ZDG:[id number] DOB: [birth year] DOA: 04/17/2023 PCP: Ihor Gully, MD  HPI/Recap of past 24 hours:  37 yof w/ medical history significant for hypertension, prior hysterectomy and inguinal hernia repair x 2 being admitted to the hospital with concern for partial bowel obstruction. She started having diffuse lower abdominal pain without radiation, and associated nausea and vomiting on 04/17/23-she vomited a total of 3 times,including once in the ER Lab work was relatively unremarkable, as were her vital signs.  CT abdomen/abdomen-concerning for possible partial small bowel obstruction.  Patient was admitted for further management General surgery was consulted.   04/22/23: The patient was seen and examined at bedside.  Negative flatulence or bowel movement.  States her last bowel movement was at least a week ago.  Encouraged to ambulate as tolerated.  PT OT ordered.  Assessment/Plan: Principal Problem:   SBO (small bowel obstruction) (HCC) Active Problems:   Enteritis  SBO: Prior history of hysterectomy and bilateral inguinal hernia repair, last colonoscopy IN 2020.   X-ray 8/1 showed no contrast in the colon, NG tube inserted    8/3 plain films with persistent SBO pattern  Appreciate surgery recs - if fails to improve, may need exploratory surgery possibly on 04/23/2023 Encouraged to ambulate as tolerated. PT OT ordered 04/22/2023   Essential hypertension: BP fairly controlled.monitor Continue to closely monitor vital signs   Mild leukocytosis: ?  Reactive, patient is afebrile, monitor WBC uptrending 11.9.  Will repeat CBC and obtain lactic acid level   Mild hypokalemia: Resolved       DVT prophylaxis: lovenox Code Status: full Family Communication: none at bedside. Disposition: Unknown at this time.  May require surgery.      Status is: Inpatient The patient requires at least 2 midnights for further evaluation and treatment of present  condition.    Objective: Vitals:   04/21/23 0644 04/21/23 1243 04/21/23 2155 04/22/23 0602  BP: (!) 155/100 (!) 154/99 (!) 163/98 (!) 121/98  Pulse: 65 70 82 (!) 110  Resp: 16  12 14   Temp: 98.2 F (36.8 C) 97.9 F (36.6 C) 98.6 F (37 C) 98.2 F (36.8 C)  TempSrc:  Oral  Oral  SpO2: 99% 99% 95% 95%  Weight:      Height:        Intake/Output Summary (Last 24 hours) at 04/22/2023 1252 Last data filed at 04/22/2023 0900 Gross per 24 hour  Intake 3079.81 ml  Output 3075 ml  Net 4.81 ml   Filed Weights   04/20/23 0507  Weight: 81 kg    Exam:  General: 72 y.o. year-old female well developed well nourished in no acute distress.  Alert and oriented x3.  NG tube in place to low intermittent wall suction. Cardiovascular: Regular rate and rhythm with no rubs or gallops.  No thyromegaly or JVD noted.   Respiratory: Clear to auscultation with no wheezes or rales. Good inspiratory effort. Abdomen: Mildly distended with diffuse tenderness on palpation.  Difficult to auscultate bowel sounds. Musculoskeletal: No lower extremity edema. 2/4 pulses in all 4 extremities. Skin: No ulcerative lesions noted or rashes, Psychiatry: Mood is appropriate for condition and setting   Data Reviewed: CBC: Recent Labs  Lab 04/17/23 1958 04/19/23 0418 04/20/23 0429 04/21/23 0548 04/22/23 0533  WBC 13.0* 12.2* 12.2* 10.7* 11.9*  HGB 15.1* 14.6 14.2 14.6 13.2  HCT 45.8 45.2 43.8 45.9 40.6  MCV 91.2 92.8 93.6 93.5 91.2  PLT 238 269 256 243 247  Basic Metabolic Panel: Recent Labs  Lab 04/17/23 2159 04/19/23 0418 04/20/23 0429 04/21/23 0548 04/22/23 0533  NA 138 142 142 139 137  K 3.4* 4.2 4.1 3.7 3.6  CL 100 106 105 102 101  CO2 25 27 29 27 25   GLUCOSE 141* 118* 101* 91 92  BUN 18 21 19 18 18   CREATININE 0.82 0.97 0.87 0.91 0.82  CALCIUM 9.8 9.0 8.8* 8.8* 8.7*  MG  --   --  2.0  --   --    GFR: Estimated Creatinine Clearance: 68.9 mL/min (by C-G formula based on SCr of 0.82  mg/dL). Liver Function Tests: Recent Labs  Lab 04/17/23 2159  AST 27  ALT 15  ALKPHOS 77  BILITOT 0.5  PROT 8.2*  ALBUMIN 4.3   Recent Labs  Lab 04/17/23 2159  LIPASE 43   No results for input(s): "AMMONIA" in the last 168 hours. Coagulation Profile: No results for input(s): "INR", "PROTIME" in the last 168 hours. Cardiac Enzymes: No results for input(s): "CKTOTAL", "CKMB", "CKMBINDEX", "TROPONINI" in the last 168 hours. BNP (last 3 results) No results for input(s): "PROBNP" in the last 8760 hours. HbA1C: No results for input(s): "HGBA1C" in the last 72 hours. CBG: No results for input(s): "GLUCAP" in the last 168 hours. Lipid Profile: No results for input(s): "CHOL", "HDL", "LDLCALC", "TRIG", "CHOLHDL", "LDLDIRECT" in the last 72 hours. Thyroid Function Tests: No results for input(s): "TSH", "T4TOTAL", "FREET4", "T3FREE", "THYROIDAB" in the last 72 hours. Anemia Panel: No results for input(s): "VITAMINB12", "FOLATE", "FERRITIN", "TIBC", "IRON", "RETICCTPCT" in the last 72 hours. Urine analysis:    Component Value Date/Time   COLORURINE YELLOW 04/17/2023 1958   APPEARANCEUR CLEAR 04/17/2023 1958   LABSPEC 1.034 (H) 04/17/2023 1958   PHURINE 6.5 04/17/2023 1958   GLUCOSEU NEGATIVE 04/17/2023 1958   HGBUR SMALL (A) 04/17/2023 1958   BILIRUBINUR NEGATIVE 04/17/2023 1958   KETONESUR TRACE (A) 04/17/2023 1958   PROTEINUR 30 (A) 04/17/2023 1958   NITRITE NEGATIVE 04/17/2023 1958   LEUKOCYTESUR SMALL (A) 04/17/2023 1958   Sepsis Labs: @LABRCNTIP (procalcitonin:4,lacticidven:4)  )No results found for this or any previous visit (from the past 240 hour(s)).    Studies: DG Abd Portable 1V  Result Date: 04/22/2023 CLINICAL DATA:  Small bowel obstruction. EXAM: PORTABLE ABDOMEN - 1 VIEW COMPARISON:  04/21/2023 FINDINGS: NG tube tip again noted in the stomach. Persistent gaseous small bowel distension noted in the abdomen with small bowel loops measuring up to 4 cm diameter,  similar to prior. No definite colonic gas evident. IMPRESSION: Persistent gaseous small bowel distension without definite colonic gas. Electronically Signed   By: Kennith Center M.D.   On: 04/22/2023 09:42    Scheduled Meds:  enoxaparin (LOVENOX) injection  40 mg Subcutaneous Q24H    Continuous Infusions:  lactated ringers 1,000 mL with potassium chloride 20 mEq infusion 125 mL/hr at 04/22/23 1251     LOS: 4 days     Darlin Drop, MD [facility] Pager [phone]  If 7PM-7AM, please contact night-coverage [email or web address] Password [facility] 04/22/2023, 12:52 PM

## 2023-04-23 ENCOUNTER — Inpatient Hospital Stay (HOSPITAL_COMMUNITY): Payer: Medicare HMO | Admitting: Registered Nurse

## 2023-04-23 ENCOUNTER — Other Ambulatory Visit: Payer: Self-pay

## 2023-04-23 ENCOUNTER — Encounter (HOSPITAL_COMMUNITY): Payer: Self-pay | Admitting: Family Medicine

## 2023-04-23 ENCOUNTER — Encounter (HOSPITAL_COMMUNITY)
Admission: EM | Disposition: A | Discharge status: Home / Self Care | Payer: Self-pay | Source: Home / Self Care | Attending: Internal Medicine

## 2023-04-23 DIAGNOSIS — K565 Intestinal adhesions [bands], unspecified as to partial versus complete obstruction: Secondary | ICD-10-CM

## 2023-04-23 DIAGNOSIS — K56609 Unspecified intestinal obstruction, unspecified as to partial versus complete obstruction: Secondary | ICD-10-CM | POA: Diagnosis not present

## 2023-04-23 HISTORY — PX: LAPAROTOMY: SHX154

## 2023-04-23 LAB — TYPE AND SCREEN
ABO/RH(D): O POS
Antibody Screen: NEGATIVE

## 2023-04-23 SURGERY — LAPAROTOMY, EXPLORATORY
Anesthesia: General

## 2023-04-23 MED ORDER — SUGAMMADEX SODIUM 200 MG/2ML IV SOLN
INTRAVENOUS | Status: DC | PRN
  Administered 2023-04-23: 200 mg via INTRAVENOUS

## 2023-04-23 MED ORDER — ROCURONIUM BROMIDE 10 MG/ML (PF) SYRINGE
PREFILLED_SYRINGE | INTRAVENOUS | Status: AC
  Filled 2023-04-23: qty 10

## 2023-04-23 MED ORDER — LACTATED RINGERS IV SOLN: INTRAVENOUS | Status: DC

## 2023-04-23 MED ORDER — LABETALOL HCL 5 MG/ML IV SOLN
10.0000 mg | Freq: Once | INTRAVENOUS | Status: AC
  Administered 2023-04-23: 10 mg via INTRAVENOUS

## 2023-04-23 MED ORDER — LIDOCAINE HCL 2 % IJ SOLN
INTRAMUSCULAR | Status: AC
  Filled 2023-04-23: qty 20

## 2023-04-23 MED ORDER — FENTANYL CITRATE (PF) 100 MCG/2ML IJ SOLN
INTRAMUSCULAR | Status: DC | PRN
  Administered 2023-04-23: 100 ug via INTRAVENOUS
  Administered 2023-04-23: 25 ug via INTRAVENOUS

## 2023-04-23 MED ORDER — ONDANSETRON HCL 4 MG/2ML IJ SOLN
INTRAMUSCULAR | Status: AC
  Filled 2023-04-23: qty 2

## 2023-04-23 MED ORDER — PHENYLEPHRINE 80 MCG/ML (10ML) SYRINGE FOR IV PUSH (FOR BLOOD PRESSURE SUPPORT)
PREFILLED_SYRINGE | INTRAVENOUS | Status: DC | PRN
  Administered 2023-04-23 (×2): 160 ug via INTRAVENOUS

## 2023-04-23 MED ORDER — BUPIVACAINE HCL (PF) 0.5 % IJ SOLN
INTRAMUSCULAR | Status: DC | PRN
  Administered 2023-04-23: 30 mL

## 2023-04-23 MED ORDER — ONDANSETRON HCL 4 MG/2ML IJ SOLN
INTRAMUSCULAR | Status: DC | PRN
Start: 2023-04-23 — End: 2023-04-23
  Administered 2023-04-23: 4 mg via INTRAVENOUS

## 2023-04-23 MED ORDER — BUPIVACAINE HCL (PF) 0.5 % IJ SOLN
INTRAMUSCULAR | Status: AC
  Filled 2023-04-23: qty 30

## 2023-04-23 MED ORDER — ROCURONIUM BROMIDE 10 MG/ML (PF) SYRINGE
PREFILLED_SYRINGE | INTRAVENOUS | Status: DC | PRN
  Administered 2023-04-23: 50 mg via INTRAVENOUS

## 2023-04-23 MED ORDER — POTASSIUM CHLORIDE 10 MEQ/100ML IV SOLN: 10.0000 meq | INTRAVENOUS | Status: AC

## 2023-04-23 MED ORDER — LABETALOL HCL 5 MG/ML IV SOLN
INTRAVENOUS | Status: AC
  Filled 2023-04-23: qty 4

## 2023-04-23 MED ORDER — PHENYLEPHRINE 80 MCG/ML (10ML) SYRINGE FOR IV PUSH (FOR BLOOD PRESSURE SUPPORT)
PREFILLED_SYRINGE | INTRAVENOUS | Status: AC
  Filled 2023-04-23: qty 10

## 2023-04-23 MED ORDER — LIDOCAINE HCL (PF) 2 % IJ SOLN
INTRAMUSCULAR | Status: AC
  Filled 2023-04-23: qty 5

## 2023-04-23 MED ORDER — HYDROMORPHONE HCL 1 MG/ML IJ SOLN
0.2500 mg | INTRAMUSCULAR | Status: DC | PRN
  Administered 2023-04-23: 0.5 mg via INTRAVENOUS

## 2023-04-23 MED ORDER — ORAL CARE MOUTH RINSE: 15.0000 mL | Freq: Once | OROMUCOSAL | Status: AC

## 2023-04-23 MED ORDER — SODIUM CHLORIDE 0.9 % IV SOLN
1.0000 g | Freq: Once | INTRAVENOUS | Status: AC
  Administered 2023-04-23: 1 g via INTRAVENOUS
  Filled 2023-04-23 (×2): qty 1

## 2023-04-23 MED ORDER — MIDAZOLAM HCL 5 MG/5ML IJ SOLN
INTRAMUSCULAR | Status: DC | PRN
  Administered 2023-04-23: 2 mg via INTRAVENOUS

## 2023-04-23 MED ORDER — 0.9 % SODIUM CHLORIDE (POUR BTL) OPTIME
TOPICAL | Status: DC | PRN
  Administered 2023-04-23: 2000 mL

## 2023-04-23 MED ORDER — AMISULPRIDE (ANTIEMETIC) 5 MG/2ML IV SOLN: 10.0000 mg | Freq: Once | INTRAVENOUS | Status: DC | PRN

## 2023-04-23 MED ORDER — ACETAMINOPHEN 10 MG/ML IV SOLN
1000.0000 mg | Freq: Four times a day (QID) | INTRAVENOUS | Status: AC
  Administered 2023-04-23 – 2023-04-24 (×4): 1000 mg via INTRAVENOUS
  Filled 2023-04-23 (×4): qty 100

## 2023-04-23 MED ORDER — HYDRALAZINE HCL 20 MG/ML IJ SOLN
INTRAMUSCULAR | Status: AC
  Administered 2023-04-23: 10 mg via INTRAVENOUS
  Filled 2023-04-23: qty 1

## 2023-04-23 MED ORDER — MEPERIDINE HCL 50 MG/ML IJ SOLN: 6.2500 mg | INTRAMUSCULAR | Status: DC | PRN

## 2023-04-23 MED ORDER — OXYCODONE HCL 5 MG PO TABS: 5.0000 mg | ORAL_TABLET | Freq: Once | ORAL | Status: DC | PRN

## 2023-04-23 MED ORDER — LIDOCAINE 2% (20 MG/ML) 5 ML SYRINGE
INTRAMUSCULAR | Status: DC | PRN
  Administered 2023-04-23: 80 mg via INTRAVENOUS
  Administered 2023-04-23: 1.5 mg/kg/h via INTRAVENOUS

## 2023-04-23 MED ORDER — DEXAMETHASONE SODIUM PHOSPHATE 10 MG/ML IJ SOLN
INTRAMUSCULAR | Status: AC
  Filled 2023-04-23: qty 1

## 2023-04-23 MED ORDER — SUCCINYLCHOLINE CHLORIDE 200 MG/10ML IV SOSY
PREFILLED_SYRINGE | INTRAVENOUS | Status: AC
  Filled 2023-04-23: qty 10

## 2023-04-23 MED ORDER — HYDRALAZINE HCL 20 MG/ML IJ SOLN: 10.0000 mg | Freq: Once | INTRAMUSCULAR | Status: AC | PRN

## 2023-04-23 MED ORDER — FENTANYL CITRATE (PF) 100 MCG/2ML IJ SOLN
INTRAMUSCULAR | Status: AC
  Filled 2023-04-23: qty 2

## 2023-04-23 MED ORDER — PROMETHAZINE HCL 25 MG/ML IJ SOLN: 6.2500 mg | INTRAMUSCULAR | Status: DC | PRN

## 2023-04-23 MED ORDER — DEXAMETHASONE SODIUM PHOSPHATE 10 MG/ML IJ SOLN
INTRAMUSCULAR | Status: DC | PRN
  Administered 2023-04-23: 8 mg via INTRAVENOUS

## 2023-04-23 MED ORDER — PROPOFOL 10 MG/ML IV BOLUS
INTRAVENOUS | Status: AC
  Filled 2023-04-23: qty 20

## 2023-04-23 MED ORDER — METHOCARBAMOL 1000 MG/10ML IJ SOLN
500.0000 mg | Freq: Four times a day (QID) | INTRAVENOUS | Status: DC | PRN
  Administered 2023-04-24 – 2023-04-25 (×2): 500 mg via INTRAVENOUS
  Filled 2023-04-23 (×2): qty 500

## 2023-04-23 MED ORDER — SUCCINYLCHOLINE CHLORIDE 200 MG/10ML IV SOSY
PREFILLED_SYRINGE | INTRAVENOUS | Status: DC | PRN
  Administered 2023-04-23: 120 mg via INTRAVENOUS

## 2023-04-23 MED ORDER — HYDROMORPHONE HCL 1 MG/ML IJ SOLN
INTRAMUSCULAR | Status: AC
  Filled 2023-04-23: qty 1

## 2023-04-23 MED ORDER — MORPHINE SULFATE (PF) 2 MG/ML IV SOLN
2.0000 mg | INTRAVENOUS | Status: DC | PRN
  Administered 2023-04-23 – 2023-04-26 (×9): 2 mg via INTRAVENOUS
  Filled 2023-04-23 (×10): qty 1

## 2023-04-23 MED ORDER — PROPOFOL 10 MG/ML IV BOLUS
INTRAVENOUS | Status: DC | PRN
Start: 2023-04-23 — End: 2023-04-23
  Administered 2023-04-23: 150 mg via INTRAVENOUS

## 2023-04-23 MED ORDER — OXYCODONE HCL 5 MG/5ML PO SOLN: 5.0000 mg | Freq: Once | ORAL | Status: DC | PRN

## 2023-04-23 MED ORDER — MIDAZOLAM HCL 2 MG/2ML IJ SOLN
INTRAMUSCULAR | Status: AC
  Filled 2023-04-23: qty 2

## 2023-04-23 MED ORDER — CHLORHEXIDINE GLUCONATE 0.12 % MT SOLN
15.0000 mL | Freq: Once | OROMUCOSAL | Status: AC
  Administered 2023-04-23: 15 mL via OROMUCOSAL

## 2023-04-23 SURGICAL SUPPLY — 32 items
APL PRP STRL LF DISP 70% ISPRP (MISCELLANEOUS) ×1
BAG COUNTER SPONGE SURGICOUNT (BAG) IMPLANT
BAG SPNG CNTER NS LX DISP (BAG)
CHLORAPREP W/TINT 26 (MISCELLANEOUS) ×1 IMPLANT
COVER MAYO STAND STRL (DRAPES) ×1 IMPLANT
COVER SURGICAL LIGHT HANDLE (MISCELLANEOUS) ×1 IMPLANT
DRAPE LAPAROSCOPIC ABDOMINAL (DRAPES) ×1 IMPLANT
DRAPE WARM FLUID 44X44 (DRAPES) ×1 IMPLANT
ELECT REM PT RETURN 15FT ADLT (MISCELLANEOUS) ×1 IMPLANT
GLOVE BIO SURGEON STRL SZ7.5 (GLOVE) ×1 IMPLANT
GLOVE BIOGEL PI IND STRL 7.0 (GLOVE) ×2 IMPLANT
GOWN STRL REUS W/ TWL XL LVL3 (GOWN DISPOSABLE) ×1 IMPLANT
GOWN STRL REUS W/TWL XL LVL3 (GOWN DISPOSABLE) ×1
HANDLE SUCTION POOLE (INSTRUMENTS) ×1 IMPLANT
KIT BASIN OR (CUSTOM PROCEDURE TRAY) ×2 IMPLANT
KIT TURNOVER KIT A (KITS) IMPLANT
LIGASURE IMPACT 36 18CM CVD LR (INSTRUMENTS) IMPLANT
NS IRRIG 1000ML POUR BTL (IV SOLUTION) ×1 IMPLANT
PACK GENERAL/GYN (CUSTOM PROCEDURE TRAY) ×1 IMPLANT
RELOAD PROXIMATE 75MM BLUE (ENDOMECHANICALS) IMPLANT
RELOAD STAPLE 75 3.8 BLU REG (ENDOMECHANICALS) IMPLANT
STAPLER GUN LINEAR PROX 60 (STAPLE) IMPLANT
STAPLER PROXIMATE 75MM BLUE (STAPLE) IMPLANT
STAPLER VISISTAT 35W (STAPLE) IMPLANT
SUCTION POOLE HANDLE (INSTRUMENTS) ×1
SUT PDS AB 1 TP1 96 (SUTURE) IMPLANT
SUT SILK 2 0 (SUTURE) ×1
SUT SILK 2 0 SH CR/8 (SUTURE) ×1 IMPLANT
SUT SILK 2-0 18XBRD TIE 12 (SUTURE) ×1 IMPLANT
SUT SILK 3 0 SH CR/8 (SUTURE) ×1 IMPLANT
TOWEL OR 17X26 10 PK STRL BLUE (TOWEL DISPOSABLE) ×1 IMPLANT
TRAY FOLEY MTR SLVR 16FR STAT (SET/KITS/TRAYS/PACK) ×1 IMPLANT

## 2023-04-23 NOTE — Plan of Care (Signed)
  Problem: Education: Goal: Knowledge of General Education information will improve Description: Including pain rating scale, medication(s)/side effects and non-pharmacologic comfort measures Outcome: Progressing   Problem: Pain Managment: Goal: General experience of comfort will improve Outcome: Progressing   

## 2023-04-23 NOTE — Progress Notes (Addendum)
PROGRESS NOTE  Beth Stone HYQ:[id number] DOB: [birth year] DOA: 04/17/2023 PCP: Ihor Gully, MD  HPI/Recap of past 24 hours:  72 yof w/ medical history significant for hypertension, prior hysterectomy and inguinal hernia repair x 2 being admitted to the hospital with concern for partial bowel obstruction. She started having diffuse lower abdominal pain without radiation, and associated nausea and vomiting on 04/17/23-she vomited a total of 3 times,including once in the ER Lab work was relatively unremarkable, as were her vital signs.  CT abdomen/abdomen-concerning for possible partial small bowel obstruction.  Patient was admitted for further management General surgery was consulted.  Endorses no bowel movement for at least 7 days.  The patient was unable to regain function of her bowels with conservative management.  On 04/23/2023 she was taken to the OR by general surgery, Dr. Magnus Ivan.  04/23/23: Seen and examined at bedside.  No bowel sounds noted on abdominal exam, tender to palpation.  Persistent ongoing output from her NG tube.  Assessment/Plan: Principal Problem:   SBO (small bowel obstruction) (HCC) Active Problems:   Enteritis  SBO, status post exploratory laparotomy, lysis of adhesions on 04/23/2023 by Dr. Magnus Ivan: Prior history of hysterectomy and bilateral inguinal hernia repair, last colonoscopy IN 2020.   X-ray 8/1 showed no contrast in the colon, NG tube inserted.  Felt to improved conservatively. Taken to the OR on 04/23/2023. Continue IV fluid, continue electrolytes replacement as indicated Pain control as needed Closely monitor vital signs   Essential hypertension: BP fairly controlled.monitor Continue to closely monitor vital signs   Mild leukocytosis: Closely monitor fever curve and WBCs Currently afebrile.   Mild hypokalemia: Repleted intravenously.       DVT prophylaxis: lovenox subcu daily. Code Status: full Family Communication: none at  bedside. Disposition: Will discharge to home once surgery signs off.      Status is: Inpatient The patient requires at least 2 midnights for further evaluation and treatment of present condition.    Objective: Vitals:   04/23/23 1245 04/23/23 1300 04/23/23 1315 04/23/23 1347  BP: (!) 149/86 (!) 140/95 (!) 146/85 (!) 146/94  Pulse: 88 93 92 99  Resp: 13 14 13 18   Temp:    98 F (36.7 C)  TempSrc:    Oral  SpO2: 92% 92% 92% 93%  Weight:      Height:        Intake/Output Summary (Last 24 hours) at 04/23/2023 1354 Last data filed at 04/23/2023 1350 Gross per 24 hour  Intake 3497.82 ml  Output 1565 ml  Net 1932.82 ml   Filed Weights   04/20/23 0507 04/23/23 0948  Weight: 81 kg 81 kg    Exam:  General: 72 y.o. year-old female well-developed, well-nourished  NG tube in place this morning.   Cardiovascular: Regular rate and rhythm no rubs or gallops. Respiratory: Clear to auscultation with no wheezes or rales. Good inspiratory effort. Abdomen: Mildly distended.  Tender.  No bowel sounds present.. Musculoskeletal: No lower extremity edema. 2/4 pulses in all 4 extremities. Skin: No ulcerative lesions noted or rashes, Psychiatry: Mood is appropriate for conditions and improved   Data Reviewed: CBC: Recent Labs  Lab 04/20/23 0429 04/21/23 0548 04/22/23 0533 04/22/23 1814 04/23/23 0419  WBC 12.2* 10.7* 11.9* 11.9* 11.0*  HGB 14.2 14.6 13.2 13.6 13.5  HCT 43.8 45.9 40.6 42.4 42.5  MCV 93.6 93.5 91.2 92.0 92.8  PLT 256 243 247 242 245   Basic Metabolic Panel: Recent Labs  Lab 04/20/23 0429 04/21/23  1610 04/22/23 0533 04/22/23 1814 04/23/23 0419  NA 142 139 137 138 138  K 4.1 3.7 3.6 4.5 3.4*  CL 105 102 101 101 103  CO2 29 27 25 27 23   GLUCOSE 101* 91 92 91 86  BUN 19 18 18 20 17   CREATININE 0.87 0.91 0.82 0.91 0.83  CALCIUM 8.8* 8.8* 8.7* 8.6* 8.4*  MG 2.0  --   --   --   --    GFR: Estimated Creatinine Clearance: 68.1 mL/min (by C-G formula based on  SCr of 0.83 mg/dL). Liver Function Tests: Recent Labs  Lab 04/17/23 2159 04/22/23 1814  AST 27 33  ALT 15 22  ALKPHOS 77 61  BILITOT 0.5 1.0  PROT 8.2* 6.2*  ALBUMIN 4.3 3.0*   Recent Labs  Lab 04/17/23 2159  LIPASE 43   No results for input(s): "AMMONIA" in the last 168 hours. Coagulation Profile: No results for input(s): "INR", "PROTIME" in the last 168 hours. Cardiac Enzymes: No results for input(s): "CKTOTAL", "CKMB", "CKMBINDEX", "TROPONINI" in the last 168 hours. BNP (last 3 results) No results for input(s): "PROBNP" in the last 8760 hours. HbA1C: No results for input(s): "HGBA1C" in the last 72 hours. CBG: No results for input(s): "GLUCAP" in the last 168 hours. Lipid Profile: No results for input(s): "CHOL", "HDL", "LDLCALC", "TRIG", "CHOLHDL", "LDLDIRECT" in the last 72 hours. Thyroid Function Tests: No results for input(s): "TSH", "T4TOTAL", "FREET4", "T3FREE", "THYROIDAB" in the last 72 hours. Anemia Panel: No results for input(s): "VITAMINB12", "FOLATE", "FERRITIN", "TIBC", "IRON", "RETICCTPCT" in the last 72 hours. Urine analysis:    Component Value Date/Time   COLORURINE YELLOW 04/17/2023 1958   APPEARANCEUR CLEAR 04/17/2023 1958   LABSPEC 1.034 (H) 04/17/2023 1958   PHURINE 6.5 04/17/2023 1958   GLUCOSEU NEGATIVE 04/17/2023 1958   HGBUR SMALL (A) 04/17/2023 1958   BILIRUBINUR NEGATIVE 04/17/2023 1958   KETONESUR TRACE (A) 04/17/2023 1958   PROTEINUR 30 (A) 04/17/2023 1958   NITRITE NEGATIVE 04/17/2023 1958   LEUKOCYTESUR SMALL (A) 04/17/2023 1958   Sepsis Labs: @LABRCNTIP (procalcitonin:4,lacticidven:4)  ) Recent Results (from the past 240 hour(s))  Surgical pcr screen     Status: None   Collection Time: 04/22/23  8:48 PM   Specimen: Nasal Mucosa; Nasal Swab  Result Value Ref Range Status   MRSA, PCR NEGATIVE NEGATIVE Final   Staphylococcus aureus NEGATIVE NEGATIVE Final    Comment: (NOTE) The Xpert SA Assay (FDA approved for NASAL specimens  in patients 71 years of age and older), is one component of a comprehensive surveillance program. It is not intended to diagnose infection nor to guide or monitor treatment. Performed at [facility], [address] [place], [place] [address]       Studies: No results found.  Scheduled Meds:  enoxaparin (LOVENOX) injection  40 mg Subcutaneous Q24H   HYDROmorphone       labetalol        Continuous Infusions:  acetaminophen 1,000 mg (04/23/23 1347)   lactated ringers 1,000 mL with potassium chloride 20 mEq infusion 125 mL/hr at 04/23/23 1345   methocarbamol (ROBAXIN) IV       LOS: 5 days     Darlin Drop, MD [facility] Pager [phone]  If 7PM-7AM, please contact night-coverage [email or web address] Password [email or web address] 04/23/2023, 1:54 PM

## 2023-04-23 NOTE — Progress Notes (Signed)
Day of Surgery   Subjective/Chief Complaint: Continues to have intermittent abdominal pain, sometimes severe No flatus High NG output   Objective: Vital signs in last 24 hours: Temp:  [98 F (36.7 C)-99.1 F (37.3 C)] 98.6 F (37 C) (08/05 0607) Pulse Rate:  [78-106] 106 (08/05 0607) Resp:  [14-18] 18 (08/05 0607) BP: (131-168)/(95-104) 131/95 (08/05 0607) SpO2:  [95 %-98 %] 98 % (08/05 0607) Last BM Date :  (pt states "I cant begin to tell you how long its been")  Intake/Output from previous day: 08/04 0701 - 08/05 0700 In: 2838.2 [I.V.:2838.2] Out: 2200 [Urine:700; Emesis/NG output:1500] Intake/Output this shift: No intake/output data recorded.  Exam: Awake and alert Looks uncomfortable Abdominal with moderate distension, minimally tender   Lab Results:  Recent Labs    04/22/23 1814 04/23/23 0419  WBC 11.9* 11.0*  HGB 13.6 13.5  HCT 42.4 42.5  PLT 242 245   BMET Recent Labs    04/22/23 1814 04/23/23 0419  NA 138 138  K 4.5 3.4*  CL 101 103  CO2 27 23  GLUCOSE 91 86  BUN 20 17  CREATININE 0.91 0.83  CALCIUM 8.6* 8.4*   PT/INR No results for input(s): "LABPROT", "INR" in the last 72 hours. ABG No results for input(s): "PHART", "HCO3" in the last 72 hours.  Invalid input(s): "PCO2", "PO2"  Studies/Results: DG Abd Portable 1V  Result Date: 04/22/2023 CLINICAL DATA:  Small bowel obstruction. EXAM: PORTABLE ABDOMEN - 1 VIEW COMPARISON:  04/21/2023 FINDINGS: NG tube tip again noted in the stomach. Persistent gaseous small bowel distension noted in the abdomen with small bowel loops measuring up to 4 cm diameter, similar to prior. No definite colonic gas evident. IMPRESSION: Persistent gaseous small bowel distension without definite colonic gas. Electronically Signed   By: Kennith Center M.D.   On: 04/22/2023 09:42    Anti-infectives: Anti-infectives (From admission, onward)    None       Assessment/Plan: SBO - CT w/ dilated small bowel loops  with decompressed distal small bowel loops. There is some stranding and edema in the mesentery.   -WBC remains elevated, bowel loops still dilated on xray without improvement and NG output remain high with no flatus or BM.  Given this, exploratory laparotomy is recommended.  She has failed to improve despite multiple days of bowel rest with nasogastric suctioning.  I explained this to her in detail.  I explained the surgical procedure in detail.  I discussed the risks which includes but is not limited to bleeding, infection, the need for bowel resection, anastomotic leak with bowel resection, the need for further procedures, injury to surrounding structures, cardiopulmonary issues, postoperative recovery, etc.  She understands and agrees to proceed with surgery which is scheduled.   Abigail Miyamoto 04/23/2023

## 2023-04-23 NOTE — Plan of Care (Signed)
  Problem: Education: Goal: Knowledge of General Education information will improve Description: Including pain rating scale, medication(s)/side effects and non-pharmacologic comfort measures Outcome: Progressing   Problem: Coping: Goal: Level of anxiety will decrease Outcome: Progressing   Problem: Pain Managment: Goal: General experience of comfort will improve Outcome: Progressing   

## 2023-04-23 NOTE — Anesthesia Preprocedure Evaluation (Signed)
Anesthesia Evaluation  Patient identified by MRN, date of birth, ID band Patient awake    Reviewed: Allergy & Precautions, H&P , NPO status , Patient's Chart, lab work & pertinent test results  Airway Mallampati: II  TM Distance: >3 FB Neck ROM: Full    Dental no notable dental hx.    Pulmonary neg pulmonary ROS   Pulmonary exam normal breath sounds clear to auscultation       Cardiovascular hypertension, negative cardio ROS Normal cardiovascular exam Rhythm:Regular Rate:Normal     Neuro/Psych negative neurological ROS  negative psych ROS   GI/Hepatic negative GI ROS, Neg liver ROS,,,  Endo/Other  negative endocrine ROS    Renal/GU negative Renal ROS  negative genitourinary   Musculoskeletal negative musculoskeletal ROS (+)    Abdominal   Peds negative pediatric ROS (+)  Hematology negative hematology ROS (+)   Anesthesia Other Findings   Reproductive/Obstetrics negative OB ROS                             Anesthesia Physical Anesthesia Plan  ASA: 2  Anesthesia Plan: General   Post-op Pain Management: Dilaudid IV   Induction: Intravenous  PONV Risk Score and Plan: 3 and Ondansetron, Dexamethasone, Midazolam and Treatment may vary due to age or medical condition  Airway Management Planned: Oral ETT  Additional Equipment:   Intra-op Plan:   Post-operative Plan: Extubation in OR  Informed Consent: I have reviewed the patients History and Physical, chart, labs and discussed the procedure including the risks, benefits and alternatives for the proposed anesthesia with the patient or authorized representative who has indicated his/her understanding and acceptance.     Dental advisory given  Plan Discussed with: CRNA  Anesthesia Plan Comments:        Anesthesia Quick Evaluation

## 2023-04-23 NOTE — Progress Notes (Addendum)
Patient's HR was elevated making her yellow but patient is c/o pain 8/10, IV Morphine 2mg  was given immediately and HR was rechecked after and HR was 106 making her green again yellow MEWs protocol was not initiated. Marland Kitchen

## 2023-04-23 NOTE — Progress Notes (Signed)
OT Cancellation Note  Patient Details Name: Beth Stone MRN: [id number] DOB: Oct 24, 1950   Cancelled Treatment:    Reason Eval/Treat Not Completed: Patient at procedure or test/ unavailable Patient is off the hall for surgery at this time. OT to continue to follow Rosalio Loud, MS Acute Rehabilitation Department Office# [phone]  04/23/2023, 9:52 AM

## 2023-04-23 NOTE — Progress Notes (Signed)
[facility] Pre-op Review  Pre-op checklist: completed  NPO: yes  Labs: WNL  Consent: completed  H&P: completed  Vitals: WNL  O2 requirements: RA 96%  MAR/PTA review: completed Cefoxitin ordered  IV: 20g RAC  Floor nurse name:  Dahlia Client RN 3E  Additional info: NA

## 2023-04-23 NOTE — Anesthesia Postprocedure Evaluation (Signed)
Anesthesia Post Note  Patient: Beth Stone  Procedure(s) Performed: EXPLORATORY LAPAROTOMY; LYSIS OF ADHESSIONS     Patient location during evaluation: PACU Anesthesia Type: General Level of consciousness: awake and alert Pain management: pain level controlled Vital Signs Assessment: post-procedure vital signs reviewed and stable Respiratory status: spontaneous breathing, nonlabored ventilation and respiratory function stable Cardiovascular status: blood pressure returned to baseline and stable Postop Assessment: no apparent nausea or vomiting Anesthetic complications: no   No notable events documented.  Last Vitals:  Vitals:   04/23/23 1300 04/23/23 1315  BP: (!) 140/95 (!) 146/85  Pulse: 93 92  Resp: 14 13  Temp:    SpO2: 92% 92%    Last Pain:  Vitals:   04/23/23 1300  TempSrc:   PainSc: Asleep                 Lowella Curb

## 2023-04-23 NOTE — Transfer of Care (Signed)
Immediate Anesthesia Transfer of Care Note  Patient: Beth Stone  Procedure(s) Performed: EXPLORATORY LAPAROTOMY; LYSIS OF ADHESSIONS  Patient Location: PACU  Anesthesia Type:General  Level of Consciousness: awake, alert , oriented, and patient cooperative  Airway & Oxygen Therapy: Patient Spontanous Breathing and Patient connected to face mask oxygen  Post-op Assessment: Report given to RN, Post -op Vital signs reviewed and stable, and Patient moving all extremities  Post vital signs: Reviewed and stable  Last Vitals:  Vitals Value Taken Time  BP 183/118 04/23/23 1123  Temp    Pulse 95 04/23/23 1126  Resp 17 04/23/23 1126  SpO2 100 % 04/23/23 1126  Vitals shown include unfiled device data.  Last Pain:  Vitals:   04/23/23 0948  TempSrc:   PainSc: 8       Patients Stated Pain Goal: 3 (04/23/23 0948)  Complications: No notable events documented.

## 2023-04-23 NOTE — Op Note (Signed)
   Beth Stone 04/23/2023   Pre-op Diagnosis: SMALL BOWEL OBSTRUCTION     Post-op Diagnosis: same  Procedure(s): EXPLORATORY LAPAROTOMY;  LYSIS OF ADHESSIONS  Surgeon(s): Abigail Miyamoto, MD  Assist: Leary Roca, PA  Anesthesia: General  Staff:  Circulator: Selena Lesser, RN Relief Circulator: Martina Sinner, RN Scrub Person: Shanon Brow, Asher Muir H, [place]  Estimated Blood Loss: Minimal               Indications: This is a 72 year old female with a small bowel obstruction which is not improved despite conservative management.  The decision was made to proceed to the operating room for an exploratory laparotomy  Findings: The patient was found to have a single thickened adhesive band creating a closed-loop small bowel obstruction.  There was no evidence of bowel ischemia.  Procedure: The patient was brought to the operating identifies the correct patient.  She was placed upon on the operating table and general anesthesia was induced.  Her abdomen was then prepped and draped in usual sterile fashion.  We created a lower midline incision with a scalpel.  We then dissected down through the subcutaneous tissue to the fascia.  We then opened the fascia and peritoneum the entire length of the incision.  The patient had a mild amount of clear fluid in the pelvis.  I eviscerated the small bowel and immediately found a very large adhesive band.  I followed this down into the right lower quadrant.  We excised the band freeing up the small bowel in 1 location and then freed up further in the second location.  The large band was creating a closed-loop obstruction.  There was no bowel ischemia.  There was no chronic stricturing either.  At this point I was able to easily milk contents through the areas in the small bowel into the cecum without evidence of ongoing obstruction.  No other adhesions were identified.  We again ran the small bowel and saw no other adhesions or  abnormalities.  We then closed the patient's midline fascia with running #1 looped PDS suture.  We anesthetized the fascia further with Marcaine.  We then closed skin with skin staples.  The patient tolerated procedure well.  All the counts were correct at the end of the procedure.  The patient was then extubated in the operating room and taken in a stable condition to the recovery room.          Abigail Miyamoto   Date: 04/23/2023  Time: 11:11 AM

## 2023-04-23 NOTE — Progress Notes (Signed)
  PT Cancellation Note  Patient Details Name: Beth Stone MRN: [id number] DOB: 07-01-1951   Cancelled Treatment:    Reason Eval/Treat Not Completed: Patient at procedure or test/unavailable  Blanchard Kelch PT Acute Rehabilitation Services Office [phone] Weekend pager-[phone]  Rada Hay 04/23/2023, 12:45 PM

## 2023-04-23 NOTE — Anesthesia Procedure Notes (Signed)
Procedure Name: Intubation Date/Time: 04/23/2023 10:34 AM  Performed by: Elisabeth Cara, CRNAPre-anesthesia Checklist: Patient identified, Emergency Drugs available, Suction available, Patient being monitored and Timeout performed Patient Re-evaluated:Patient Re-evaluated prior to induction Oxygen Delivery Method: Circle system utilized Preoxygenation: Pre-oxygenation with 100% oxygen Induction Type: Rapid sequence, Cricoid Pressure applied and IV induction Laryngoscope Size: Mac and 4 Grade View: Grade I Tube type: Oral Tube size: 7.5 mm Number of attempts: 1 Airway Equipment and Method: Stylet Placement Confirmation: ETT inserted through vocal cords under direct vision, positive ETCO2 and breath sounds checked- equal and bilateral Secured at: 22 cm Tube secured with: Tape Dental Injury: Teeth and Oropharynx as per pre-operative assessment  Comments: Smooth RSI with cricoid pressure by DR Hyacinth Meeker. Grade 1 view with MAC 4. ATOI. + ETCO. BBS=

## 2023-04-24 ENCOUNTER — Encounter (HOSPITAL_COMMUNITY): Payer: Self-pay | Admitting: Surgery

## 2023-04-24 DIAGNOSIS — K56609 Unspecified intestinal obstruction, unspecified as to partial versus complete obstruction: Secondary | ICD-10-CM | POA: Diagnosis not present

## 2023-04-24 MED ORDER — HYDROMORPHONE HCL 1 MG/ML IJ SOLN
0.5000 mg | INTRAMUSCULAR | Status: AC
  Administered 2023-04-24: 0.5 mg via INTRAVENOUS
  Filled 2023-04-24: qty 0.5

## 2023-04-24 NOTE — Progress Notes (Signed)
   04/24/23 1450  TOC Brief Assessment  Insurance and Status Reviewed  Patient has primary care physician Yes  Home environment has been reviewed Resides with spouse  Prior level of function: Independent at baseline  Prior/Current Home Services No current home services  Social Determinants of Health Reivew SDOH reviewed no interventions necessary  Readmission risk has been reviewed Yes  Transition of care needs no transition of care needs at this time

## 2023-04-24 NOTE — Evaluation (Signed)
Physical Therapy Evaluation Patient Details Name: Beth Stone MRN: [id number] DOB: 02/14/1951 Today's Date: 04/24/2023  History of Present Illness  Pt is 72 yo female admitted 04/17/23 with SBO.  Initially tried conservative management but required exp lap with LOA on 04/23/23.  Pt with hx including but not limited to HTN, hernia repair x 2, hysterectomy.  Clinical Impression  Pt admitted with above diagnosis. At baseline, pt is independent and active. She has support at home.  Today, pt needed light min A for supine/sit but otherwise was supervision to ambulate 300'.  Demonstrate safe balance and good safety awareness.  Pt is below her baseline, but does not require skilled PT services to advance and has necessary home support.  Recommend ambulation with mobility team and nursing staff.         If plan is discharge home, recommend the following: Assistance with cooking/housework;Help with stairs or ramp for entrance;A little help with walking and/or transfers   Can travel by private vehicle        Equipment Recommendations None recommended by PT  Recommendations for Other Services       Functional Status Assessment Patient has had a recent decline in their functional status and demonstrates the ability to make significant improvements in function in a reasonable and predictable amount of time.     Precautions / Restrictions Precautions Precautions: Fall      Mobility  Bed Mobility Overal bed mobility: Needs Assistance Bed Mobility: Supine to Sit, Sit to Supine     Supine to sit: Min assist, HOB elevated Sit to supine: Supervision, HOB elevated   General bed mobility comments: Light min A to lift trunk    Transfers Overall transfer level: Needs assistance Equipment used: None Transfers: Sit to/from Stand Sit to Stand: Supervision           General transfer comment: Stood from bed and toilet    Ambulation/Gait Ambulation/Gait assistance: Supervision Gait  Distance (Feet): 300 Feet Assistive device: IV Pole, None Gait Pattern/deviations: Step-through pattern, Trunk flexed Gait velocity: decreased but functional     General Gait Details: Slight trunk flexion.  Held IV pole for support but also able to take steps without.  Steady gait no LOB  Stairs            Wheelchair Mobility     Tilt Bed    Modified Rankin (Stroke Patients Only)       Balance Overall balance assessment: Needs assistance Sitting-balance support: No upper extremity supported Sitting balance-Leahy Scale: Normal     Standing balance support: No upper extremity supported Standing balance-Leahy Scale: Good Standing balance comment: Performed her own toielting ADLs and washing hands in standing                             Pertinent Vitals/Pain Pain Assessment Pain Assessment: No/denies pain    Home Living Family/patient expects to be discharged to:: Private residence Living Arrangements: Spouse/significant other;Children Available Help at Discharge: Family;Available 24 hours/day Type of Home: House Home Access: Level entry       Home Layout: One level Home Equipment: None      Prior Function Prior Level of Function : Independent/Modified Independent             Mobility Comments: Likes to stay active, ambulates in community without difficulty ADLs Comments: Was pulling up carpet and digging up trees in yard recently     Hand Dominance  Extremity/Trunk Assessment   Upper Extremity Assessment Upper Extremity Assessment: Overall WFL for tasks assessed    Lower Extremity Assessment Lower Extremity Assessment: Overall WFL for tasks assessed    Cervical / Trunk Assessment Cervical / Trunk Assessment: Normal;Other exceptions Cervical / Trunk Exceptions: Does leaning forward slightly due to abdominal sx/pain  Communication   Communication: No difficulties  Cognition Arousal/Alertness: Awake/alert Behavior During  Therapy: WFL for tasks assessed/performed Overall Cognitive Status: Within Functional Limits for tasks assessed                                          General Comments General comments (skin integrity, edema, etc.): Educated on positions of comfort for sleep (HOB elevated or wedges to elevate, pillows under knees), hugging pillow if coughing/sneezing/etc, frequent mobility    Exercises     Assessment/Plan    PT Assessment Patient does not need any further PT services  PT Problem List         PT Treatment Interventions      PT Goals (Current goals can be found in the Care Plan section)  Acute Rehab PT Goals Patient Stated Goal: return home PT Goal Formulation: All assessment and education complete, DC therapy    Frequency       Co-evaluation               AM-PAC PT "6 Clicks" Mobility  Outcome Measure Help needed turning from your back to your side while in a flat bed without using bedrails?: None Help needed moving from lying on your back to sitting on the side of a flat bed without using bedrails?: A Little Help needed moving to and from a bed to a chair (including a wheelchair)?: A Little Help needed standing up from a chair using your arms (e.g., wheelchair or bedside chair)?: A Little Help needed to walk in hospital room?: A Little Help needed climbing 3-5 steps with a railing? : A Little 6 Click Score: 19    End of Session Equipment Utilized During Treatment: Gait belt Activity Tolerance: Patient tolerated treatment well Patient left: in bed;with call bell/phone within reach Nurse Communication: Mobility status PT Visit Diagnosis: Other abnormalities of gait and mobility (R26.89);Muscle weakness (generalized) (M62.81)    Time: 0981-1914 PT Time Calculation (min) (ACUTE ONLY): 22 min   Charges:   PT Evaluation $PT Eval Low Complexity: 1 Low   PT General Charges $$ ACUTE PT VISIT: 1 Visit         Anise Salvo, PT Acute Rehab  Services [facility] [phone]   Rayetta Humphrey 04/24/2023, 1:40 PM

## 2023-04-24 NOTE — Plan of Care (Signed)
  Problem: Education: Goal: Knowledge of General Education information will improve Description Including pain rating scale, medication(s)/side effects and non-pharmacologic comfort measures Outcome: Progressing   Problem: Health Behavior/Discharge Planning: Goal: Ability to manage health-related needs will improve Outcome: Progressing   

## 2023-04-24 NOTE — Plan of Care (Signed)
  Problem: Clinical Measurements: Goal: Ability to maintain clinical measurements within normal limits will improve Outcome: Progressing Goal: Will remain free from infection Outcome: Progressing Goal: Diagnostic test results will improve Outcome: Progressing   Problem: Activity: Goal: Risk for activity intolerance will decrease Outcome: Progressing   Problem: Pain Managment: Goal: General experience of comfort will improve Outcome: Progressing   Problem: Safety: Goal: Ability to remain free from injury will improve Outcome: Progressing   Problem: Skin Integrity: Goal: Risk for impaired skin integrity will decrease Outcome: Progressing   

## 2023-04-24 NOTE — Progress Notes (Signed)
1 Day Post-Op  Subjective: CC: Feels better than before surgery. Sore around her incision. Pain well controlled with medications. No nausea. NGT output bilious with 1.8L/24 hours. Passing flatus. No BM. Mobilized this am. Foley in place with good uop.   Afebrile. Tachycardic at 109 this am. No hypotension. WBC 15 from 11. K 3.8. Cr wnl.   Objective: Vital signs in last 24 hours: Temp:  [97.6 F (36.4 C)-98.4 F (36.9 C)] 98.4 F (36.9 C) (08/06 0354) Pulse Rate:  [72-109] 109 (08/06 0354) Resp:  [12-18] 15 (08/06 0354) BP: (140-178)/(85-116) 143/97 (08/06 0354) SpO2:  [90 %-100 %] 94 % (08/06 0354) Last BM Date :  (Pt doesn't know. None since prior to admission.)  Intake/Output from previous day: 08/05 0701 - 08/06 0700 In: 4148.4 [I.V.:3686; IV Piggyback:462.5] Out: 4015 [Urine:2200; Emesis/NG output:1800; Blood:15] Intake/Output this shift: Total I/O In: 120 [NG/GT:120] Out: 400 [Urine:200; Emesis/NG output:200]  PE: Gen:  Alert, NAD, pleasant Abd: Soft, mild distension, appropriately tender around incision, no rigidity or guarding and otherwise NT, +BS. Midline wound with honeycomb dressing in place - few small areas of dried blood on dressing but no signs of active bleeding - otherwise cdi. NGT in place with bilious output.  GU: Foley bag with clear straw colored urine.   Lab Results:  Recent Labs    04/23/23 0419 04/24/23 0435  WBC 11.0* 15.0*  HGB 13.5 12.4  HCT 42.5 37.4  PLT 245 246   BMET Recent Labs    04/23/23 0419 04/24/23 0435  NA 138 137  K 3.4* 3.8  CL 103 101  CO2 23 25  GLUCOSE 86 120*  BUN 17 16  CREATININE 0.83 0.75  CALCIUM 8.4* 8.4*   PT/INR No results for input(s): "LABPROT", "INR" in the last 72 hours. CMP     Component Value Date/Time   NA 137 04/24/2023 0435   K 3.8 04/24/2023 0435   CL 101 04/24/2023 0435   CO2 25 04/24/2023 0435   GLUCOSE 120 (H) 04/24/2023 0435   BUN 16 04/24/2023 0435   CREATININE 0.75 04/24/2023  0435   CALCIUM 8.4 (L) 04/24/2023 0435   PROT 6.2 (L) 04/22/2023 1814   ALBUMIN 3.0 (L) 04/22/2023 1814   AST 33 04/22/2023 1814   ALT 22 04/22/2023 1814   ALKPHOS 61 04/22/2023 1814   BILITOT 1.0 04/22/2023 1814   GFRNONAA >60 04/24/2023 0435   GFRAA >60 03/20/2020 0514   Lipase     Component Value Date/Time   LIPASE 43 04/17/2023 2159    Studies/Results: No results found.  Anti-infectives: Anti-infectives (From admission, onward)    Start     Dose/Rate Route Frequency Ordered Stop   04/23/23 0830  cefOXitin (MEFOXIN) 1 g in sodium chloride 0.9 % 100 mL IVPB        1 g 200 mL/hr over 30 Minutes Intravenous  Once 04/23/23 0735 04/24/23 0700        Assessment/Plan POD 1 s/p exploratory laparotomy, LOA for SBO by Dr. Magnus Ivan on 04/23/23 - Intra-op findings: The patient was found to have a single thickened adhesive band creating a closed-loop small bowel obstruction. There was no evidence of bowel ischemia.  - Cont NGT to LIWS today. AROBF.  - Mobilize, PT. Okay to clamp NGT for mobilization.  - Pulm toilet  FEN - NPO, NGT to LIWS, IVF per TRH VTE - SCDs,  ID - Peri-op abx.  Foley - Remove today (TOV).     LOS: 6 days  Jacinto Halim , [facility] 04/24/2023, 10:24 AM Please see Amion for pager number during day hours 7:00am-4:30pm

## 2023-04-24 NOTE — Evaluation (Signed)
Occupational Therapy Evaluation Patient Details Name: Beth Stone MRN: [id number] DOB: May 20, 1951 Today's Date: 04/24/2023   History of Present Illness Pt is 72 yr old female admitted 04/17/23 with small bowel obstruction.  Initially tried conservative management but required exploratory laparotomy with lysis of adhesions on 04/23/23.  Pt with hx including but not limited to HTN, hernia repair x 2, hysterectomy.   Clinical Impression   The pt currently requires supervision for tasks, including sit to stand, lower body dressing, and toileting at bathroom level. OT educated her general post-op precautions & implementing compensatory strategies for self-care management; she presented with good understanding, recall, and teach back abilities. She does not require further OT services. OT will sign off and recommend she return home with family support upon hospital discharge.       Recommendations for follow up therapy are one component of a multi-disciplinary discharge planning process, led by the attending physician.  Recommendations may be updated based on patient status, additional functional criteria and insurance authorization.   Assistance Recommended at Discharge Intermittent Supervision/Assistance  Patient can return home with the following Assistance with cooking/housework;Assist for transportation    Functional Status Assessment  Patient has not had a recent decline in their functional status  Equipment Recommendations  Tub/shower seat    Recommendations for Other Services       Precautions / Restrictions Precautions Precautions: Fall Restrictions Weight Bearing Restrictions: No Other Position/Activity Restrictions: abdominal precautions      Mobility Bed Mobility Overal bed mobility: Needs Assistance Bed Mobility: Supine to Sit, Sit to Supine     Supine to sit: Min guard     General bed mobility comments: OT instructed her on implementing the log roll technique for  bed mobility    Transfers Overall transfer level: Needs assistance Equipment used: None Transfers: Sit to/from Stand Sit to Stand: Supervision                  Balance       Sitting balance - Comments: static sitting-good. dynamic sitting-fair+       Standing balance comment: supervision           ADL either performed or assessed with clinical judgement   ADL     Eating/Feeding Details (indicate cue type and reason): Pt is currently NPO. She has adequate BUE function needed to self feed independently. Grooming: Set up;Sitting Grooming Details (indicate cue type and reason): simulated seated EOB       Lower Body Bathing Details (indicate cue type and reason): OT educated her on the option for use of a long handled sponge and shower chair for safety during bathing tasks in the home. Upper Body Dressing : Supervision/safety;Set up;Sitting Upper Body Dressing Details (indicate cue type and reason): simulated seated EOB Lower Body Dressing: Supervision/safety;With adaptive equipment Lower Body Dressing Details (indicate cue type and reason): OT instructed the pt on implementing the figure 4 technique for lower body dressing tasks, vs. using a sock aid and reacher to perform. She provided teach back on the aforementioned with regards to performing sock management seated EOB. Toilet Transfer: Supervision/safety;Ambulation Toilet Transfer Details (indicate cue type and reason): at bathroom level, based on clinical judgement                 Vision Baseline Vision/History: 1 Wears glasses Additional Comments: she correctly read the time depicted on the wall clock            Pertinent Vitals/Pain Pain Assessment Pain Score:  4  Pain Location: pain reported with activity only Pain Intervention(s): Limited activity within patient's tolerance, Monitored during session, Repositioned     Hand Dominance Right   Extremity/Trunk Assessment Upper Extremity  Assessment Upper Extremity Assessment: Overall WFL for tasks assessed   Lower Extremity Assessment Lower Extremity Assessment: Overall WFL for tasks assessed     Communication Communication Communication: No difficulties   Cognition Arousal/Alertness: Awake/alert Behavior During Therapy: WFL for tasks assessed/performed Overall Cognitive Status: Within Functional Limits for tasks assessed        General Comments: Oriented x4, able to follow commands without difficulty     General Comments              Home Living Family/patient expects to be discharged to:: Private residence Living Arrangements: Spouse/significant other Available Help at Discharge: Family Type of Home: House Home Access: Level entry     Home Layout: One level     Bathroom Shower/Tub: Tub/shower unit        Home Equipment: None          Prior Functioning/Environment Prior Level of Function : Independent/Modified Independent             Mobility Comments: Likes to stay active, ambulates in community without difficulty ADLs Comments: Independent with ADLs, working in the yard, driving, cooking, and Best boy Treatment/Interventions:   N/A      OT Frequency:  N/A       AM-PAC OT "6 Clicks" Daily Activity     Outcome Measure Help from another person eating meals?: Total Help from another person taking care of personal grooming?: A Little Help from another person toileting, which includes using toliet, bedpan, or urinal?: None Help from another person bathing (including washing, rinsing, drying)?: A Little Help from another person to put on and taking off regular upper body clothing?: None Help from another person to put on and taking off regular lower body clothing?: A Little 6 Click Score: 18   End of Session Equipment Utilized During Treatment:  (N/A) Nurse Communication: Other (comment)  Activity Tolerance: Patient tolerated treatment well Patient left: in  bed;with call bell/phone within reach;with bed alarm set;with family/visitor present  OT Visit Diagnosis: Pain Pain - part of body:  (abdominal)                Time: 0272-5366 OT Time Calculation (min): 23 min Charges:  OT General Charges $OT Visit: 1 Visit OT Evaluation $OT Eval Low Complexity: 1 Low OT Treatments $Self Care/Home Management : 8-22 mins   Reuben Likes, OTR/L 04/24/2023, 2:44 PM

## 2023-04-24 NOTE — Progress Notes (Signed)
PROGRESS NOTE  Beth Stone:[id number] DOB: [birth year] DOA: 04/17/2023 PCP: Ihor Gully, MD  HPI/Recap of past 24 hours: 14 yof w/ medical history significant for hypertension, prior hysterectomy and inguinal hernia repair x 2 being admitted to the hospital for SBO.  She started having diffuse lower abdominal pain without radiation, and associated nausea and vomiting on 04/17/23.  CT abdomen/abdomen-concerning for possible partial small bowel obstruction.  Patient was admitted for further management.  Followed by General surgery.  No bowel movement for at least 7 days prior to admission.  The patient was unable to regain function of her bowels with conservative management.  On 04/23/2023 she was taken to the OR by general surgery, Dr. Magnus Ivan.  Status post exploratory laparotomy on 04/23/2023.  04/24/23: Seen and examined at bedside.  Reports intermittent abdominal cramping worse when she sits up.  Advised to mobilize frequently with assistance and fall precautions.  Assessment/Plan: Principal Problem:   SBO (small bowel obstruction) (HCC) Active Problems:   Enteritis  SBO, status post exploratory laparotomy, lysis of adhesions on 04/23/2023 by Dr. Magnus Ivan: Prior history of hysterectomy and bilateral inguinal hernia repair, last colonoscopy IN 2020.   X-ray 8/1 showed no contrast in the colon, NG tube inserted.  Felt to improved conservatively. Taken to the OR on 04/23/2023. Continue IV fluid, continue electrolytes replacement as indicated Pain control as needed Closely monitor vital signs N.p.o.   Essential hypertension: BP fairly controlled Continue to closely monitor vital signs   Leukocytosis, suspect reactive postsurgery. Closely monitor fever curve and WBCs Currently afebrile, nonseptic appearing.   Resolved mild hypokalemia, post repletion: Continue to monitor electrolytes and replete as indicated.       DVT prophylaxis: lovenox subcu daily. Code Status:  full Family Communication: none at bedside. Disposition: Will discharge to home once surgery signs off.   Consult: General Surgery.   Status is: Inpatient The patient requires at least 2 midnights for further evaluation and treatment of present condition.    Objective: Vitals:   04/23/23 2209 04/24/23 0011 04/24/23 0354 04/24/23 1207  BP: (!) 152/103 (!) 154/94 (!) 143/97 (!) 145/95  Pulse: 85 75 (!) 109 80  Resp: 18 14 15 16   Temp: 98.4 F (36.9 C) 97.6 F (36.4 C) 98.4 F (36.9 C) (!) 97.5 F (36.4 C)  TempSrc: Oral Oral Oral Oral  SpO2: 95% 97% 94% 96%  Weight:      Height:        Intake/Output Summary (Last 24 hours) at 04/24/2023 1256 Last data filed at 04/24/2023 1211 Gross per 24 hour  Intake 3337.41 ml  Output 4750 ml  Net -1412.59 ml   Filed Weights   04/20/23 0507 04/23/23 0948  Weight: 81 kg 81 kg    Exam:  General: 72 y.o. year-old female well-developed well-nourished in no acute distress.  NG tube in place. Cardiovascular: Regular rate and rhythm no rubs or gallops. Respiratory: Clear to auscultation with no wheezes or rales.   Abdomen: Surgical incision present.  Hypoactive bowel sounds. Musculoskeletal: No lower extremity edema bilaterally. Skin: No ulcerative lesions noted or rashes, Psychiatry: Mood is appropriate for condition and setting.   Data Reviewed: CBC: Recent Labs  Lab 04/21/23 0548 04/22/23 0533 04/22/23 1814 04/23/23 0419 04/24/23 0435  WBC 10.7* 11.9* 11.9* 11.0* 15.0*  HGB 14.6 13.2 13.6 13.5 12.4  HCT 45.9 40.6 42.4 42.5 37.4  MCV 93.5 91.2 92.0 92.8 90.8  PLT 243 247 242 245 246   Basic Metabolic Panel: Recent  Labs  Lab 04/20/23 0429 04/21/23 0548 04/22/23 0533 04/22/23 1814 04/23/23 0419 04/24/23 0435  NA 142 139 137 138 138 137  K 4.1 3.7 3.6 4.5 3.4* 3.8  CL 105 102 101 101 103 101  CO2 29 27 25 27 23 25   GLUCOSE 101* 91 92 91 86 120*  BUN 19 18 18 20 17 16   CREATININE 0.87 0.91 0.82 0.91 0.83 0.75   CALCIUM 8.8* 8.8* 8.7* 8.6* 8.4* 8.4*  MG 2.0  --   --   --   --   --    GFR: Estimated Creatinine Clearance: 70.7 mL/min (by C-G formula based on SCr of 0.75 mg/dL). Liver Function Tests: Recent Labs  Lab 04/17/23 2159 04/22/23 1814  AST 27 33  ALT 15 22  ALKPHOS 77 61  BILITOT 0.5 1.0  PROT 8.2* 6.2*  ALBUMIN 4.3 3.0*   Recent Labs  Lab 04/17/23 2159  LIPASE 43   No results for input(s): "AMMONIA" in the last 168 hours. Coagulation Profile: No results for input(s): "INR", "PROTIME" in the last 168 hours. Cardiac Enzymes: No results for input(s): "CKTOTAL", "CKMB", "CKMBINDEX", "TROPONINI" in the last 168 hours. BNP (last 3 results) No results for input(s): "PROBNP" in the last 8760 hours. HbA1C: No results for input(s): "HGBA1C" in the last 72 hours. CBG: No results for input(s): "GLUCAP" in the last 168 hours. Lipid Profile: No results for input(s): "CHOL", "HDL", "LDLCALC", "TRIG", "CHOLHDL", "LDLDIRECT" in the last 72 hours. Thyroid Function Tests: No results for input(s): "TSH", "T4TOTAL", "FREET4", "T3FREE", "THYROIDAB" in the last 72 hours. Anemia Panel: No results for input(s): "VITAMINB12", "FOLATE", "FERRITIN", "TIBC", "IRON", "RETICCTPCT" in the last 72 hours. Urine analysis:    Component Value Date/Time   COLORURINE YELLOW 04/17/2023 1958   APPEARANCEUR CLEAR 04/17/2023 1958   LABSPEC 1.034 (H) 04/17/2023 1958   PHURINE 6.5 04/17/2023 1958   GLUCOSEU NEGATIVE 04/17/2023 1958   HGBUR SMALL (A) 04/17/2023 1958   BILIRUBINUR NEGATIVE 04/17/2023 1958   KETONESUR TRACE (A) 04/17/2023 1958   PROTEINUR 30 (A) 04/17/2023 1958   NITRITE NEGATIVE 04/17/2023 1958   LEUKOCYTESUR SMALL (A) 04/17/2023 1958   Sepsis Labs: @LABRCNTIP (procalcitonin:4,lacticidven:4)  ) Recent Results (from the past 240 hour(s))  Surgical pcr screen     Status: None   Collection Time: 04/22/23  8:48 PM   Specimen: Nasal Mucosa; Nasal Swab  Result Value Ref Range Status    MRSA, PCR NEGATIVE NEGATIVE Final   Staphylococcus aureus NEGATIVE NEGATIVE Final    Comment: (NOTE) The Xpert SA Assay (FDA approved for NASAL specimens in patients 18 years of age and older), is one component of a comprehensive surveillance program. It is not intended to diagnose infection nor to guide or monitor treatment. Performed at [facility], [address], [place], [place] [address]       Studies: No results found.  Scheduled Meds:  enoxaparin (LOVENOX) injection  40 mg Subcutaneous Q24H    Continuous Infusions:  lactated ringers 1,000 mL with potassium chloride 20 mEq infusion 125 mL/hr at 04/24/23 0722   methocarbamol (ROBAXIN) IV 500 mg (04/24/23 0437)     LOS: 6 days     Darlin Drop, MD [facility] Pager [phone]  If 7PM-7AM, please contact night-coverage [email or web address] Password [facility] 04/24/2023, 12:56 PM

## 2023-04-24 NOTE — Care Management Important Message (Signed)
Important Message  Patient Details IM Letter given. Name: MICHALINA HUSCHER MRN: [id number] Date of Birth: Feb 08, 1951   Medicare Important Message Given:  Yes     Caren Macadam 04/24/2023, 3:48 PM

## 2023-04-25 DIAGNOSIS — K56609 Unspecified intestinal obstruction, unspecified as to partial versus complete obstruction: Secondary | ICD-10-CM | POA: Diagnosis not present

## 2023-04-25 DIAGNOSIS — E43 Unspecified severe protein-calorie malnutrition: Secondary | ICD-10-CM | POA: Insufficient documentation

## 2023-04-25 MED ORDER — MAGNESIUM SULFATE 2 GM/50ML IV SOLN
2.0000 g | Freq: Once | INTRAVENOUS | Status: AC
  Administered 2023-04-25: 2 g via INTRAVENOUS
  Filled 2023-04-25: qty 50

## 2023-04-25 NOTE — Progress Notes (Addendum)
2 Days Post-Op  Subjective: CC: Feels better today. Had crampy abdominal pain, burping/belching and hiccups yesterday. This has improved today with resolution of her crampy abdominal pain and much less burping/belching/hiccups. She denies nausea. Her NGT put out 1.35L of bilious output in the last 24 hours with downtrending output in the 2nd 12 hours (800cc > 550cc). Her NGT is currently clamped after mobilization and she has not had any nausea, worsening of her burping/belching/hiccups or any crampy abdominal pain. She does have some pain near her incision that is worse with movement and well controlled with medications. She is passing flatus with more frequent episodes. Foley out and voiding without issues.    Afebrile. Tachycardia resolved. No hypotension. WBC downtrending at 12.7. K 3.6  Objective: Vital signs in last 24 hours: Temp:  [97.5 F (36.4 C)-98.6 F (37 C)] 98.6 F (37 C) (08/07 0627) Pulse Rate:  [76-83] 78 (08/07 0627) Resp:  [16-18] 18 (08/07 0627) BP: (145-154)/(88-95) 147/93 (08/07 0627) SpO2:  [91 %-96 %] 96 % (08/07 0627) Last BM Date :  (UTD (patient unsure))  Intake/Output from previous day: 08/06 0701 - 08/07 0700 In: 3037.8 [I.V.:2775.6; NG/GT:120; IV Piggyback:142.2] Out: 3550 [Urine:2200; Emesis/NG output:1350] Intake/Output this shift: No intake/output data recorded.  PE: Gen:  Alert, NAD, pleasant Abd: Soft, less distension, appropriately tender around incision, no rigidity or guarding and otherwise NT, +BS. Midline wound with honeycomb dressing in place - few small areas of dried blood on dressing but no signs of active bleeding - otherwise cdi. Some BS. NGT clamped.   Lab Results:  Recent Labs    04/24/23 0435 04/25/23 0424  WBC 15.0* 12.7*  HGB 12.4 11.6*  HCT 37.4 36.6  PLT 246 245   BMET Recent Labs    04/24/23 0435 04/25/23 0424  NA 137 136  K 3.8 3.6  CL 101 102  CO2 25 25  GLUCOSE 120* 91  BUN 16 15  CREATININE 0.75 0.88   CALCIUM 8.4* 8.2*   PT/INR No results for input(s): "LABPROT", "INR" in the last 72 hours. CMP     Component Value Date/Time   NA 136 04/25/2023 0424   K 3.6 04/25/2023 0424   CL 102 04/25/2023 0424   CO2 25 04/25/2023 0424   GLUCOSE 91 04/25/2023 0424   BUN 15 04/25/2023 0424   CREATININE 0.88 04/25/2023 0424   CALCIUM 8.2 (L) 04/25/2023 0424   PROT 6.2 (L) 04/22/2023 1814   ALBUMIN 3.0 (L) 04/22/2023 1814   AST 33 04/22/2023 1814   ALT 22 04/22/2023 1814   ALKPHOS 61 04/22/2023 1814   BILITOT 1.0 04/22/2023 1814   GFRNONAA >60 04/25/2023 0424   GFRAA >60 03/20/2020 0514   Lipase     Component Value Date/Time   LIPASE 43 04/17/2023 2159    Studies/Results: No results found.  Anti-infectives: Anti-infectives (From admission, onward)    Start     Dose/Rate Route Frequency Ordered Stop   04/23/23 0830  cefOXitin (MEFOXIN) 1 g in sodium chloride 0.9 % 100 mL IVPB        1 g 200 mL/hr over 30 Minutes Intravenous  Once 04/23/23 0735 04/24/23 0700        Assessment/Plan POD 2 s/p exploratory laparotomy, LOA for SBO by Dr. Magnus Ivan on 04/23/23 - Intra-op findings: The patient was found to have a single thickened adhesive band creating a closed-loop small bowel obstruction. There was no evidence of bowel ischemia.  - Ileus appears to be improving. Will  keep NPO and clamp NGT for 4 hours to check residuals. If low residuals can try clamp and clears later today. If any nausea, vomiting or worsening of abdominal pain/distension will rehook to [employer] - discussed this plan with patient, family and RN.  - Mobilize, PT. Okay to clamp NGT for mobilization.  - Pulm toilet  FEN - NPO, clamp NGT, IVF per TRH VTE - SCDs, Loveonx ID - Peri-op abx. None currently. Afebrile. WBC downtrending.  Foley - Out, voiding.     LOS: 7 days    Jacinto Halim , [facility] 04/25/2023, 9:27 AM Please see Amion for pager number during day hours 7:00am-4:30pm

## 2023-04-25 NOTE — Progress Notes (Signed)
PROGRESS NOTE    Beth Stone  ZDG:[id number] DOB: Apr 12, 1951 DOA: 04/17/2023 PCP: Ihor Gully, MD   Brief Narrative: 72 year old with past medical history significant for hypertension, prior hysterectomy and inguinal hernia repair x 2 admitted for SBO.  Presented with diffuse abdominal pain, CT abdomen and pelvis concerning for possible partial small bowel obstruction.  Patient did not respond to conservative management, she require exploratory laparotomy on 8/5 for general surgery. Currently awaiting bowel function to return   Assessment & Plan:   Principal Problem:   SBO (small bowel obstruction) (HCC) Active Problems:   Enteritis   SBO, status post exploratory laparotomy, lysis of adhesions on 04/23/2023 by Dr. Magnus Ivan: -Prior history of hysterectomy and bilateral inguinal hernia repair, last colonoscopy 2020.   -X-ray 8/1 showed no contrast in the colon, NG tube inserted.  Fail to improved conservatively. -Underwent exploratory laparotomy and lysis of adhesion on 04/23/2023. -Continue IV fluid, continue electrolytes replacement as indicated -NG tube clamp.    Essential hypertension: Elevated. On PRN hydralazine.     Leukocytosis, suspect reactive postsurgery. Closely monitor fever curve and WBCs Trending down. Afebrile.    Hypokalemia; replaced.  Hypomagnesemia; replaced IV  Estimated body mass index is 27.97 kg/m as calculated from the following:   Height as of this encounter: 5\' 7"  (1.702 m).   Weight as of this encounter: 81 kg.   DVT prophylaxis: Lovenox Code Status: full code Family Communication: care discussed with patient.  Disposition Plan:  Status is: Inpatient Remains inpatient appropriate because: post SBO sx   Consultants:  General Sx  Procedures:  -Underwent exploratory laparotomy and lysis of adhesion on 04/23/2023.  Antimicrobials:    Subjective: She is feeling ok, report some abdominal pain. GNT has been clamp. Report passing  gas.   Objective: Vitals:   04/24/23 1207 04/24/23 2028 04/25/23 0203 04/25/23 0627  BP: (!) 145/95 (!) 154/88 (!) 153/94 (!) 147/93  Pulse: 80 76 83 78  Resp: 16 18 18 18   Temp: (!) 97.5 F (36.4 C) 98.2 F (36.8 C) 98.6 F (37 C) 98.6 F (37 C)  TempSrc: Oral Oral Oral Oral  SpO2: 96% 93% 91% 96%  Weight:      Height:        Intake/Output Summary (Last 24 hours) at 04/25/2023 0804 Last data filed at 04/25/2023 0600 Gross per 24 hour  Intake 2917.82 ml  Output 3350 ml  Net -432.18 ml   Filed Weights   04/20/23 0507 04/23/23 0948  Weight: 81 kg 81 kg    Examination:  General exam: Appears calm and comfortable  Respiratory system: Clear to auscultation. Respiratory effort normal. Cardiovascular system: S1 & S2 heard, RRR. No JVD, murmurs, rubs, gallops or clicks. No pedal edema. Gastrointestinal system: Abdomen is nondistended, soft and nontender. No organomegaly or masses felt. Normal bowel sounds heard. Central nervous system: Alert and oriented. No focal neurological deficits. Extremities: Symmetric 5 x 5 power. Skin: No rashes, lesions or ulcers Psychiatry: Judgement and insight appear normal. Mood & affect appropriate.     Data Reviewed: I have personally reviewed following labs and imaging studies  CBC: Recent Labs  Lab 04/22/23 0533 04/22/23 1814 04/23/23 0419 04/24/23 0435 04/25/23 0424  WBC 11.9* 11.9* 11.0* 15.0* 12.7*  HGB 13.2 13.6 13.5 12.4 11.6*  HCT 40.6 42.4 42.5 37.4 36.6  MCV 91.2 92.0 92.8 90.8 93.1  PLT 247 242 245 246 245   Basic Metabolic Panel: Recent Labs  Lab 04/20/23 0429 04/21/23 0548 04/22/23 0533  04/22/23 1814 04/23/23 0419 04/24/23 0435 04/25/23 0424  NA 142   < > 137 138 138 137 136  K 4.1   < > 3.6 4.5 3.4* 3.8 3.6  CL 105   < > 101 101 103 101 102  CO2 29   < > 25 27 23 25 25   GLUCOSE 101*   < > 92 91 86 120* 91  BUN 19   < > 18 20 17 16 15   CREATININE 0.87   < > 0.82 0.91 0.83 0.75 0.88  CALCIUM 8.8*   < > 8.7*  8.6* 8.4* 8.4* 8.2*  MG 2.0  --   --   --   --   --  1.8  PHOS  --   --   --   --   --   --  2.5   < > = values in this interval not displayed.   GFR: Estimated Creatinine Clearance: 64.2 mL/min (by C-G formula based on SCr of 0.88 mg/dL). Liver Function Tests: Recent Labs  Lab 04/22/23 1814  AST 33  ALT 22  ALKPHOS 61  BILITOT 1.0  PROT 6.2*  ALBUMIN 3.0*   No results for input(s): "LIPASE", "AMYLASE" in the last 168 hours. No results for input(s): "AMMONIA" in the last 168 hours. Coagulation Profile: No results for input(s): "INR", "PROTIME" in the last 168 hours. Cardiac Enzymes: No results for input(s): "CKTOTAL", "CKMB", "CKMBINDEX", "TROPONINI" in the last 168 hours. BNP (last 3 results) No results for input(s): "PROBNP" in the last 8760 hours. HbA1C: No results for input(s): "HGBA1C" in the last 72 hours. CBG: No results for input(s): "GLUCAP" in the last 168 hours. Lipid Profile: No results for input(s): "CHOL", "HDL", "LDLCALC", "TRIG", "CHOLHDL", "LDLDIRECT" in the last 72 hours. Thyroid Function Tests: No results for input(s): "TSH", "T4TOTAL", "FREET4", "T3FREE", "THYROIDAB" in the last 72 hours. Anemia Panel: No results for input(s): "VITAMINB12", "FOLATE", "FERRITIN", "TIBC", "IRON", "RETICCTPCT" in the last 72 hours. Sepsis Labs: Recent Labs  Lab 04/22/23 1813 04/22/23 1814 04/22/23 2106  PROCALCITON  --  <0.10  --   LATICACIDVEN 1.1  --  1.1    Recent Results (from the past 240 hour(s))  Surgical pcr screen     Status: None   Collection Time: 04/22/23  8:48 PM   Specimen: Nasal Mucosa; Nasal Swab  Result Value Ref Range Status   MRSA, PCR NEGATIVE NEGATIVE Final   Staphylococcus aureus NEGATIVE NEGATIVE Final    Comment: (NOTE) The Xpert SA Assay (FDA approved for NASAL specimens in patients 74 years of age and older), is one component of a comprehensive surveillance program. It is not intended to diagnose infection nor to guide or monitor  treatment. Performed at [facility], [address], [place], [place] [address]          Radiology Studies: No results found.      Scheduled Meds:  enoxaparin (LOVENOX) injection  40 mg Subcutaneous Q24H   Continuous Infusions:  lactated ringers 1,000 mL with potassium chloride 20 mEq infusion 125 mL/hr at 04/25/23 0012   methocarbamol (ROBAXIN) IV 500 mg (04/25/23 0038)     LOS: 7 days    Time spent: 35 minutes    Myisha Pickerel A Vondell Babers, MD [facility]   If 7PM-7AM, please contact night-coverage [email or web address]  04/25/2023, 8:04 AM

## 2023-04-25 NOTE — Progress Notes (Signed)
Initial Nutrition Assessment  DOCUMENTATION CODES:   Severe malnutrition in context of acute illness/injury  INTERVENTION:   -If diet is unable to be advanced beyond clears in 24 hours, would recommend TPN initiation. As of today, no nutrition for >7 days. -Once PO or nutrition support started, recommend Monitor magnesium, potassium, and phosphorus  for at least 3 days, MD to replete as needed, as pt is at risk for refeeding syndrome.  -If diet advanced, recommend Ensure Plus High Protein po BID, each supplement provides 350 kcal and 20 grams of protein.  -B complex w/ Vitamin C daily  NUTRITION DIAGNOSIS:   Severe Malnutrition related to acute illness, constipation as evidenced by mild fat depletion, moderate muscle depletion, energy intake < or equal to 50% for > or equal to 5 days.  GOAL:   Patient will meet greater than or equal to 90% of their needs  MONITOR:   Diet advancement, Labs, Weight trends, I & O's  REASON FOR ASSESSMENT:   LOS, NPO/Clear Liquid Diet (x 7 days)    ASSESSMENT:   46 yof w/ medical history significant for hypertension, prior hysterectomy and inguinal hernia repair x 2 being admitted to the hospital for SBO.  She started having diffuse lower abdominal pain without radiation, and associated nausea and vomiting on 04/17/23.  CT abdomen/abdomen-concerning for possible partial small bowel obstruction.  Patient was admitted for further management.  7/30: admitted, NPO 7/31: CLD 8/1: NGT placed to suction 8/5: s/p ex lap, LOA  Patient in room, resting her eyes, states she is having dry eyes today. Pt states she is feeling better today, no nausea currently. NGT is clamped. Did have >1L output yesterday.   Per patient, she has tried to consume a high fiber, high fluid diet to combat constipation but she still would struggle to have consistent BMs. Pt would eat lots of fresh fruits and vegetables as well as oatmeal every morning along with drinking plenty of  fluids. Never tried any fiber supplements.  Was taking a daily MVI with iron but was told to stop this.  Constipation lasted so long she then developed N/V PTA.  Per surgery note, plan is to clamp NGT for 4 hours today, check residuals and possibly advance to clears. Will monitor. If unable to advance past clears, recommend TPN initiation. At refeeding syndrome risk regardless.  Medications: Lactated ringers  Labs reviewed:    NUTRITION - FOCUSED PHYSICAL EXAM:  Flowsheet Row Most Recent Value  Orbital Region Mild depletion  Upper Arm Region Mild depletion  Thoracic and Lumbar Region Mild depletion  Buccal Region Mild depletion  Temple Region Mild depletion  Clavicle Bone Region Moderate depletion  Clavicle and Acromion Bone Region Moderate depletion  Scapular Bone Region Moderate depletion  Dorsal Hand Moderate depletion  Patellar Region Unable to assess  Anterior Thigh Region Unable to assess  Posterior Calf Region Unable to assess  Edema (RD Assessment) None  Hair Reviewed  Eyes Reviewed  Mouth Reviewed  Skin Reviewed       Diet Order:   Diet Order             Diet NPO time specified  Diet effective midnight                   EDUCATION NEEDS:   No education needs have been identified at this time  Skin:  Skin Assessment: Skin Integrity Issues: Skin Integrity Issues:: Incisions Incisions: 8/5 abdomen  Last BM:  PTA -pt can't remember  Height:   Ht Readings from Last 1 Encounters:  04/23/23 5\' 7"  (1.702 m)    Weight:   Wt Readings from Last 1 Encounters:  04/23/23 81 kg    BMI:  Body mass index is 27.97 kg/m.  Estimated Nutritional Needs:   Kcal:  1800-2000  Protein:  75-90g  Fluid:  2L/day    Tilda Franco, MS, RD, LDN Inpatient Clinical Dietitian Contact information available via Amion

## 2023-04-26 DIAGNOSIS — K56609 Unspecified intestinal obstruction, unspecified as to partial versus complete obstruction: Secondary | ICD-10-CM | POA: Diagnosis not present

## 2023-04-26 LAB — CBC
HCT: 37.2 % (ref 36.0–46.0)
Hemoglobin: 11.8 g/dL — ABNORMAL LOW (ref 12.0–15.0)
MCH: 29.5 pg (ref 26.0–34.0)
MCHC: 31.7 g/dL (ref 30.0–36.0)
MCV: 93 fL (ref 80.0–100.0)
Platelets: 250 10*3/uL (ref 150–400)
RBC: 4 MIL/uL (ref 3.87–5.11)
RDW: 12.6 % (ref 11.5–15.5)
WBC: 9.6 10*3/uL (ref 4.0–10.5)
nRBC: 0 % (ref 0.0–0.2)

## 2023-04-26 LAB — BASIC METABOLIC PANEL
Anion gap: 11 (ref 5–15)
BUN: 11 mg/dL (ref 8–23)
CO2: 21 mmol/L — ABNORMAL LOW (ref 22–32)
Calcium: 8.4 mg/dL — ABNORMAL LOW (ref 8.9–10.3)
Chloride: 106 mmol/L (ref 98–111)
Creatinine, Ser: 0.77 mg/dL (ref 0.44–1.00)
GFR, Estimated: >60 mL/min (ref >60)
Glucose, Bld: 93 mg/dL (ref 70–99)
Potassium: 3.8 mmol/L (ref 3.5–5.1)
Sodium: 138 mmol/L (ref 135–145)

## 2023-04-26 LAB — MAGNESIUM: Magnesium: 2.1 mg/dL (ref 1.7–2.4)

## 2023-04-26 MED ORDER — BISACODYL 10 MG RE SUPP
10.0000 mg | Freq: Once | RECTAL | Status: AC
  Administered 2023-04-26: 10 mg via RECTAL
  Filled 2023-04-26: qty 1

## 2023-04-26 MED ORDER — MENTHOL 3 MG MT LOZG
1.0000 | LOZENGE | OROMUCOSAL | Status: DC | PRN
  Filled 2023-04-26: qty 9

## 2023-04-26 MED ORDER — BENZONATATE 100 MG PO CAPS: 100.0000 mg | ORAL_CAPSULE | Freq: Three times a day (TID) | ORAL | Status: DC | PRN

## 2023-04-26 NOTE — Progress Notes (Signed)
PROGRESS NOTE    Beth Stone  ZOX:[id number] DOB: November 23, 1950 DOA: 04/17/2023 PCP: Ihor Gully, MD   Brief Narrative: 72 year old with past medical history significant for hypertension, prior hysterectomy and inguinal hernia repair x 2 admitted for SBO.  Presented with diffuse abdominal pain, CT abdomen and pelvis concerning for possible partial small bowel obstruction.  Patient did not respond to conservative management, she require exploratory laparotomy on 8/5 by  general surgery. Currently awaiting bowel function to return   Assessment & Plan:   Principal Problem:   SBO (small bowel obstruction) (HCC) Active Problems:   Enteritis   Protein-calorie malnutrition, severe   SBO, status post exploratory laparotomy, lysis of adhesions on 04/23/2023 by Dr. Magnus Ivan: -Prior history of hysterectomy and bilateral inguinal hernia repair, last colonoscopy 2020.   -X-ray 8/1 showed no contrast in the colon, NG tube inserted.  Fail to improved conservatively. -Underwent exploratory laparotomy and lysis of adhesion on 04/23/2023. -Continue IV fluid, continue electrolytes replacement as indicated -NG tube  removed 8/07 No Bowel movement yet, passing gas.  Plan to continue with clear liquid diet.   Essential hypertension: Elevated. On PRN hydralazine.     Leukocytosis, suspect reactive postsurgery. Closely monitor fever curve and WBCs Trending down. Afebrile.    Hypokalemia; Replaced.  Hypomagnesemia; Replaced.   Estimated body mass index is 27.97 kg/m as calculated from the following:   Height as of this encounter: 5\' 7"  (1.702 m).   Weight as of this encounter: 81 kg.   DVT prophylaxis: Lovenox Code Status: full code Family Communication: care discussed with patient.  Disposition Plan:  Status is: Inpatient Remains inpatient appropriate because: post SBO sx   Consultants:  General Sx  Procedures:  -Underwent exploratory laparotomy and lysis of adhesion on  04/23/2023.  Antimicrobials:    Subjective: Report cough, and mild sore throat. Abdominal pain not worse, passing gas.  No BM yet.    Objective: Vitals:   04/25/23 1007 04/25/23 1818 04/25/23 2114 04/26/23 0610  BP: (!) 162/102 (!) 143/108 (!) 154/100 (!) 146/94  Pulse: 91 95 (!) 102 (!) 102  Resp: 14 15 18 18   Temp: 98.4 F (36.9 C) 98.9 F (37.2 C) 98.6 F (37 C) 98.6 F (37 C)  TempSrc: Oral Oral Oral Oral  SpO2: 96% 96% 95% 93%  Weight:      Height:        Intake/Output Summary (Last 24 hours) at 04/26/2023 1211 Last data filed at 04/26/2023 0900 Gross per 24 hour  Intake 1499.34 ml  Output 2950 ml  Net -1450.66 ml   Filed Weights   04/20/23 0507 04/23/23 0948  Weight: 81 kg 81 kg    Examination:  General exam: NAD Respiratory system: CTA Cardiovascular system: S 1, S 2 RRR Gastrointestinal system: BS present, soft, midline wound with dressing.  Central nervous system: Alert, conversant.  Extremities: no edema.   Data Reviewed: I have personally reviewed following labs and imaging studies  CBC: Recent Labs  Lab 04/22/23 1814 04/23/23 0419 04/24/23 0435 04/25/23 0424 04/26/23 0853  WBC 11.9* 11.0* 15.0* 12.7* 9.6  HGB 13.6 13.5 12.4 11.6* 11.8*  HCT 42.4 42.5 37.4 36.6 37.2  MCV 92.0 92.8 90.8 93.1 93.0  PLT 242 245 246 245 250   Basic Metabolic Panel: Recent Labs  Lab 04/20/23 0429 04/21/23 0548 04/22/23 1814 04/23/23 0419 04/24/23 0435 04/25/23 0424 04/26/23 0853  NA 142   < > 138 138 137 136 138  K 4.1   < >  4.5 3.4* 3.8 3.6 3.8  CL 105   < > 101 103 101 102 106  CO2 29   < > 27 23 25 25  21*  GLUCOSE 101*   < > 91 86 120* 91 93  BUN 19   < > 20 17 16 15 11   CREATININE 0.87   < > 0.91 0.83 0.75 0.88 0.77  CALCIUM 8.8*   < > 8.6* 8.4* 8.4* 8.2* 8.4*  MG 2.0  --   --   --   --  1.8 2.1  PHOS  --   --   --   --   --  2.5  --    < > = values in this interval not displayed.   GFR: Estimated Creatinine Clearance: 70.7 mL/min (by C-G  formula based on SCr of 0.77 mg/dL). Liver Function Tests: Recent Labs  Lab 04/22/23 1814  AST 33  ALT 22  ALKPHOS 61  BILITOT 1.0  PROT 6.2*  ALBUMIN 3.0*   No results for input(s): "LIPASE", "AMYLASE" in the last 168 hours. No results for input(s): "AMMONIA" in the last 168 hours. Coagulation Profile: No results for input(s): "INR", "PROTIME" in the last 168 hours. Cardiac Enzymes: No results for input(s): "CKTOTAL", "CKMB", "CKMBINDEX", "TROPONINI" in the last 168 hours. BNP (last 3 results) No results for input(s): "PROBNP" in the last 8760 hours. HbA1C: No results for input(s): "HGBA1C" in the last 72 hours. CBG: No results for input(s): "GLUCAP" in the last 168 hours. Lipid Profile: No results for input(s): "CHOL", "HDL", "LDLCALC", "TRIG", "CHOLHDL", "LDLDIRECT" in the last 72 hours. Thyroid Function Tests: No results for input(s): "TSH", "T4TOTAL", "FREET4", "T3FREE", "THYROIDAB" in the last 72 hours. Anemia Panel: No results for input(s): "VITAMINB12", "FOLATE", "FERRITIN", "TIBC", "IRON", "RETICCTPCT" in the last 72 hours. Sepsis Labs: Recent Labs  Lab 04/22/23 1813 04/22/23 1814 04/22/23 2106  PROCALCITON  --  <0.10  --   LATICACIDVEN 1.1  --  1.1    Recent Results (from the past 240 hour(s))  Surgical pcr screen     Status: None   Collection Time: 04/22/23  8:48 PM   Specimen: Nasal Mucosa; Nasal Swab  Result Value Ref Range Status   MRSA, PCR NEGATIVE NEGATIVE Final   Staphylococcus aureus NEGATIVE NEGATIVE Final    Comment: (NOTE) The Xpert SA Assay (FDA approved for NASAL specimens in patients 101 years of age and older), is one component of a comprehensive surveillance program. It is not intended to diagnose infection nor to guide or monitor treatment. Performed at [facility], [address] [place], [place] [address]          Radiology Studies: No results found.      Scheduled Meds:  bisacodyl  10 mg Rectal  Once   enoxaparin (LOVENOX) injection  40 mg Subcutaneous Q24H   Continuous Infusions:  lactated ringers 1,000 mL with potassium chloride 20 mEq infusion 50 mL/hr at 04/26/23 0721   methocarbamol (ROBAXIN) IV 500 mg (04/25/23 0038)     LOS: 8 days    Time spent: 35 minutes    Morrell Fluke A Isaiah Torok, MD [facility]   If 7PM-7AM, please contact night-coverage [email or web address]  04/26/2023, 12:11 PM

## 2023-04-26 NOTE — Progress Notes (Signed)
3 Days Post-Op   Subjective/Chief Complaint: Tolerating NG out Passing flatus but no BM yet Pain controlled   Objective: Vital signs in last 24 hours: Temp:  [98.4 F (36.9 C)-98.9 F (37.2 C)] 98.6 F (37 C) (08/08 0610) Pulse Rate:  [91-102] 102 (08/08 0610) Resp:  [14-18] 18 (08/08 0610) BP: (143-162)/(94-108) 146/94 (08/08 0610) SpO2:  [93 %-96 %] 93 % (08/08 0610) Last BM Date :  (UTA)  Intake/Output from previous day: 08/07 0701 - 08/08 0700 In: 1867.9 [P.O.:540; I.V.:1247; IV Piggyback:80.9] Out: 3750 [Urine:3750] Intake/Output this shift: Total I/O In: 31.7 [I.V.:31.7] Out: -   Exam: Awake and alert Abdomen soft, just a little full, dressing dry  Lab Results:  Recent Labs    04/24/23 0435 04/25/23 0424  WBC 15.0* 12.7*  HGB 12.4 11.6*  HCT 37.4 36.6  PLT 246 245   BMET Recent Labs    04/24/23 0435 04/25/23 0424  NA 137 136  K 3.8 3.6  CL 101 102  CO2 25 25  GLUCOSE 120* 91  BUN 16 15  CREATININE 0.75 0.88  CALCIUM 8.4* 8.2*   PT/INR No results for input(s): "LABPROT", "INR" in the last 72 hours. ABG No results for input(s): "PHART", "HCO3" in the last 72 hours.  Invalid input(s): "PCO2", "PO2"  Studies/Results: No results found.  Anti-infectives: Anti-infectives (From admission, onward)    Start     Dose/Rate Route Frequency Ordered Stop   04/23/23 0830  cefOXitin (MEFOXIN) 1 g in sodium chloride 0.9 % 100 mL IVPB        1 g 200 mL/hr over 30 Minutes Intravenous  Once 04/23/23 0735 04/24/23 0700       Assessment/Plan: POD 3 s/p exploratory laparotomy, LOA for SBO by Dr. Magnus Ivan on 04/23/23 - Intra-op findings: The patient was found to have a single thickened adhesive band creating a closed-loop small bowel obstruction. There was no evidence of bowel ischemia.   Tolerating NG out and clears Keep on clears for now given exam Try suppository    Abigail Miyamoto MD 04/26/2023

## 2023-04-26 NOTE — Plan of Care (Signed)
  Problem: Education: Goal: Knowledge of General Education information will improve Description: Including pain rating scale, medication(s)/side effects and non-pharmacologic comfort measures Outcome: Progressing   Problem: Activity: Goal: Risk for activity intolerance will decrease Outcome: Progressing   Problem: Nutrition: Goal: Adequate nutrition will be maintained Outcome: Progressing   

## 2023-04-27 DIAGNOSIS — K56609 Unspecified intestinal obstruction, unspecified as to partial versus complete obstruction: Secondary | ICD-10-CM | POA: Diagnosis not present

## 2023-04-27 MED ORDER — POTASSIUM CHLORIDE CRYS ER 20 MEQ PO TBCR
40.0000 meq | EXTENDED_RELEASE_TABLET | Freq: Once | ORAL | Status: AC
  Administered 2023-04-27: 40 meq via ORAL
  Filled 2023-04-27: qty 2

## 2023-04-27 MED ORDER — B COMPLEX-C PO TABS
1.0000 | ORAL_TABLET | Freq: Every day | ORAL | Status: DC
  Administered 2023-04-27 – 2023-04-28 (×2): 1 via ORAL
  Filled 2023-04-27 (×2): qty 1

## 2023-04-27 MED ORDER — OXYCODONE HCL 5 MG PO TABS: 5.0000 mg | ORAL_TABLET | ORAL | Status: DC | PRN

## 2023-04-27 MED ORDER — MORPHINE SULFATE (PF) 2 MG/ML IV SOLN: 2.0000 mg | INTRAVENOUS | Status: DC | PRN

## 2023-04-27 MED ORDER — AMLODIPINE BESYLATE 5 MG PO TABS
5.0000 mg | ORAL_TABLET | Freq: Every day | ORAL | Status: DC
  Administered 2023-04-27 – 2023-04-28 (×2): 5 mg via ORAL
  Filled 2023-04-27 (×2): qty 1

## 2023-04-27 MED ORDER — ENSURE ENLIVE PO LIQD: 237.0000 mL | Freq: Two times a day (BID) | ORAL | Status: DC

## 2023-04-27 MED ORDER — ACETAMINOPHEN 500 MG PO TABS: 1000.0000 mg | ORAL_TABLET | Freq: Four times a day (QID) | ORAL | Status: DC | PRN

## 2023-04-27 NOTE — Progress Notes (Addendum)
    4 Days Post-Op  Subjective: CC: Doing well. Only having incisional abdominal pain with movement. No abdominal pain at rest. Tolerating CLD without n/v. Passing flatus. 3 bm's yesterday. Voiding. Mobilizing.   Afebrile. WBC wnl.   Objective: Vital signs in last 24 hours: Temp:  [98.3 F (36.8 C)-98.5 F (36.9 C)] 98.5 F (36.9 C) (08/09 0537) Pulse Rate:  [70-92] 70 (08/09 0537) Resp:  [15-18] 18 (08/09 0537) BP: (134-160)/(90-97) 140/95 (08/09 0537) SpO2:  [96 %-97 %] 97 % (08/09 0537) Last BM Date : 04/26/23  Intake/Output from previous day: 08/08 0701 - 08/09 0700 In: 1662.1 [P.O.:600; I.V.:1062.1] Out: 2350 [Urine:2350] Intake/Output this shift: No intake/output data recorded.  PE: Gen:  Alert, NAD, pleasant Abd: Soft, ND, appropriately tender around incision, no rigidity or guarding and otherwise NT, +BS. Midline wound with honeycomb dressing in place - few small areas of dried blood on dressing but no signs of active bleeding - otherwise cdi. + BS.   Lab Results:  Recent Labs    04/26/23 0853 04/27/23 0435  WBC 9.6 8.7  HGB 11.8* 12.1  HCT 37.2 37.7  PLT 250 256   BMET Recent Labs    04/26/23 0853 04/27/23 0435  NA 138 134*  K 3.8 3.5  CL 106 102  CO2 21* 22  GLUCOSE 93 110*  BUN 11 8  CREATININE 0.77 0.79  CALCIUM 8.4* 8.1*   PT/INR No results for input(s): "LABPROT", "INR" in the last 72 hours. CMP     Component Value Date/Time   NA 134 (L) 04/27/2023 0435   K 3.5 04/27/2023 0435   CL 102 04/27/2023 0435   CO2 22 04/27/2023 0435   GLUCOSE 110 (H) 04/27/2023 0435   BUN 8 04/27/2023 0435   CREATININE 0.79 04/27/2023 0435   CALCIUM 8.1 (L) 04/27/2023 0435   PROT 6.2 (L) 04/22/2023 1814   ALBUMIN 3.0 (L) 04/22/2023 1814   AST 33 04/22/2023 1814   ALT 22 04/22/2023 1814   ALKPHOS 61 04/22/2023 1814   BILITOT 1.0 04/22/2023 1814   GFRNONAA >60 04/27/2023 0435   GFRAA >60 03/20/2020 0514   Lipase     Component Value Date/Time    LIPASE 43 04/17/2023 2159    Studies/Results: No results found.  Anti-infectives: Anti-infectives (From admission, onward)    Start     Dose/Rate Route Frequency Ordered Stop   04/23/23 0830  cefOXitin (MEFOXIN) 1 g in sodium chloride 0.9 % 100 mL IVPB        1 g 200 mL/hr over 30 Minutes Intravenous  Once 04/23/23 0735 04/24/23 0700        Assessment/Plan POD 4 s/p exploratory laparotomy, LOA for SBO by Dr. Magnus Ivan on 04/23/23 - Intra-op findings: The patient was found to have a single thickened adhesive band creating a closed-loop small bowel obstruction. There was no evidence of bowel ischemia.  - ADAT - Mobilize, PT - Pulm toilet  FEN - FLD, ADAT to Soft diet, IVF per TRH VTE - SCDs, Loveonx ID - Peri-op abx. None currently. Afebrile. WBC wnl Foley - Out, voiding.    LOS: 9 days    Jacinto Halim , [facility] Surgery 04/27/2023, 8:47 AM Please see Amion for pager number during day hours 7:00am-4:30pm

## 2023-04-27 NOTE — Progress Notes (Signed)
Nutrition Follow-up  DOCUMENTATION CODES:   Severe malnutrition in context of acute illness/injury  INTERVENTION:   Monitor magnesium, potassium, and phosphorus  for at least 3 days, MD to replete as needed, as pt is at risk for refeeding syndrome.   -Ensure Plus High Protein po BID, each supplement provides 350 kcal and 20 grams of protein.   -B complex w/ Vitamin C daily  NUTRITION DIAGNOSIS:   Severe Malnutrition related to acute illness, constipation as evidenced by mild fat depletion, moderate muscle depletion, energy intake < or equal to 50% for > or equal to 5 days.  Ongoing.  GOAL:   Patient will meet greater than or equal to 90% of their needs  Not met.  MONITOR:   PO intake, Supplement acceptance, Labs, Weight trends, I & O's  ASSESSMENT:   60 yof w/ medical history significant for hypertension, prior hysterectomy and inguinal hernia repair x 2 being admitted to the hospital for SBO.  She started having diffuse lower abdominal pain without radiation, and associated nausea and vomiting on 04/17/23.  CT abdomen/abdomen-concerning for possible partial small bowel obstruction.  Patient was admitted for further management.  7/30: admitted, NPO 7/31: CLD 8/1: NGT placed to suction 8/5: s/p ex lap, LOA 8/7: NGT removed, CLD  Patient's diet now full liquids. Have ordered Ensure supplements and daily B complex given poor nutrition for >1 week now. Will continue to monitor diet advancement.  Admission weight: 178 lbs  Medications: KLOR-CON  Labs reviewed: Low Na  Diet Order:   Diet Order             Diet full liquid Fluid consistency: Thin  Diet effective now                   EDUCATION NEEDS:   No education needs have been identified at this time  Skin:  Skin Assessment: Skin Integrity Issues: Skin Integrity Issues:: Incisions Incisions: 8/5 abdomen  Last BM:  8/8 -type 5  Height:   Ht Readings from Last 1 Encounters:  04/23/23 5\' 7"  (1.702  m)    Weight:   Wt Readings from Last 1 Encounters:  04/23/23 81 kg    BMI:  Body mass index is 27.97 kg/m.  Estimated Nutritional Needs:   Kcal:  1800-2000  Protein:  75-90g  Fluid:  2L/day  Tilda Franco, MS, RD, LDN Inpatient Clinical Dietitian Contact information available via Amion

## 2023-04-27 NOTE — Care Management Important Message (Signed)
Important Message  Patient Details IM Letter given. Name: KAMMIE GEARHEART MRN: [id number] Date of Birth: [birth year]   Medicare Important Message Given:  Yes     Caren Macadam 04/27/2023, 10:34 AM

## 2023-04-27 NOTE — Progress Notes (Signed)
PROGRESS NOTE    Beth Stone  KGM:[id number] DOB: 03-05-1951 DOA: 04/17/2023 PCP: Ihor Gully, MD   Brief Narrative: 72 year old with past medical history significant for hypertension, prior hysterectomy and inguinal hernia repair x 2 admitted for SBO.  Presented with diffuse abdominal pain, CT abdomen and pelvis concerning for possible partial small bowel obstruction.  Patient did not respond to conservative management, she require exploratory laparotomy on 8/5 by  general surgery.     Assessment & Plan:   Principal Problem:   SBO (small bowel obstruction) (HCC) Active Problems:   Enteritis   Protein-calorie malnutrition, severe   SBO, status post exploratory laparotomy, lysis of adhesions on 04/23/2023 by Dr. Magnus Ivan: -Prior history of hysterectomy and bilateral inguinal hernia repair, last colonoscopy 2020.   -X-ray 8/1 showed no contrast in the colon, NG tube inserted.  Fail to improved conservatively. -Underwent exploratory laparotomy and lysis of adhesion on 04/23/2023. -Continue IV fluid, continue electrolytes replacement as indicated -NG tube  removed 8/07 -She had multiples BM last night.  Plan to advanced diet today. She might be able to be discharge tomorrow.    Essential hypertension: Elevated. On PRN hydralazine.  Will start Low dose Norvasc.    Leukocytosis, suspect reactive postsurgery. Closely monitor fever curve and WBCs Trending down. Afebrile.    Hypokalemia; Replaced.  Hypomagnesemia; Replaced.   Estimated body mass index is 27.97 kg/m as calculated from the following:   Height as of this encounter: 5\' 7"  (1.702 m).   Weight as of this encounter: 81 kg.   DVT prophylaxis: Lovenox Code Status: full code Family Communication: care discussed with patient.  Disposition Plan:  Status is: Inpatient Remains inpatient appropriate because: post SBO sx   Consultants:  General Sx  Procedures:  -Underwent exploratory laparotomy and lysis  of adhesion on 04/23/2023.  Antimicrobials:    Subjective: She had BM. Feels better. Diet has been advanced.  Sore throat better. Denies cough    Objective: Vitals:   04/26/23 1438 04/26/23 2137 04/27/23 0537 04/27/23 1357  BP: (!) 134/90 (!) 160/97 (!) 140/95 (!) 151/98  Pulse: 92 88 70 79  Resp: 15 18 18 17   Temp: 98.4 F (36.9 C) 98.3 F (36.8 C) 98.5 F (36.9 C) 98.7 F (37.1 C)  TempSrc: Oral Oral Oral Oral  SpO2: 97% 96% 97% 100%  Weight:      Height:        Intake/Output Summary (Last 24 hours) at 04/27/2023 1537 Last data filed at 04/27/2023 1417 Gross per 24 hour  Intake 1824.91 ml  Output 2350 ml  Net -525.09 ml   Filed Weights   04/20/23 0507 04/23/23 0948  Weight: 81 kg 81 kg    Examination:  General exam: NAD Respiratory system: CTA Cardiovascular system: S 1, S 2 RRR Gastrointestinal system: BS present, soft, mild tender. midline wound with dressing.  Central nervous system: Alert, conversant Extremities: No edema  Data Reviewed: I have personally reviewed following labs and imaging studies  CBC: Recent Labs  Lab 04/23/23 0419 04/24/23 0435 04/25/23 0424 04/26/23 0853 04/27/23 0435  WBC 11.0* 15.0* 12.7* 9.6 8.7  HGB 13.5 12.4 11.6* 11.8* 12.1  HCT 42.5 37.4 36.6 37.2 37.7  MCV 92.8 90.8 93.1 93.0 93.5  PLT 245 246 245 250 256   Basic Metabolic Panel: Recent Labs  Lab 04/23/23 0419 04/24/23 0435 04/25/23 0424 04/26/23 0853 04/27/23 0435  NA 138 137 136 138 134*  K 3.4* 3.8 3.6 3.8 3.5  CL 103 101  102 106 102  CO2 23 25 25  21* 22  GLUCOSE 86 120* 91 93 110*  BUN 17 16 15 11 8   CREATININE 0.83 0.75 0.88 0.77 0.79  CALCIUM 8.4* 8.4* 8.2* 8.4* 8.1*  MG  --   --  1.8 2.1  --   PHOS  --   --  2.5  --   --    GFR: Estimated Creatinine Clearance: 70.7 mL/min (by C-G formula based on SCr of 0.79 mg/dL). Liver Function Tests: Recent Labs  Lab 04/22/23 1814  AST 33  ALT 22  ALKPHOS 61  BILITOT 1.0  PROT 6.2*  ALBUMIN 3.0*    No results for input(s): "LIPASE", "AMYLASE" in the last 168 hours. No results for input(s): "AMMONIA" in the last 168 hours. Coagulation Profile: No results for input(s): "INR", "PROTIME" in the last 168 hours. Cardiac Enzymes: No results for input(s): "CKTOTAL", "CKMB", "CKMBINDEX", "TROPONINI" in the last 168 hours. BNP (last 3 results) No results for input(s): "PROBNP" in the last 8760 hours. HbA1C: No results for input(s): "HGBA1C" in the last 72 hours. CBG: No results for input(s): "GLUCAP" in the last 168 hours. Lipid Profile: No results for input(s): "CHOL", "HDL", "LDLCALC", "TRIG", "CHOLHDL", "LDLDIRECT" in the last 72 hours. Thyroid Function Tests: No results for input(s): "TSH", "T4TOTAL", "FREET4", "T3FREE", "THYROIDAB" in the last 72 hours. Anemia Panel: No results for input(s): "VITAMINB12", "FOLATE", "FERRITIN", "TIBC", "IRON", "RETICCTPCT" in the last 72 hours. Sepsis Labs: Recent Labs  Lab 04/22/23 1813 04/22/23 1814 04/22/23 2106  PROCALCITON  --  <0.10  --   LATICACIDVEN 1.1  --  1.1    Recent Results (from the past 240 hour(s))  Surgical pcr screen     Status: None   Collection Time: 04/22/23  8:48 PM   Specimen: Nasal Mucosa; Nasal Swab  Result Value Ref Range Status   MRSA, PCR NEGATIVE NEGATIVE Final   Staphylococcus aureus NEGATIVE NEGATIVE Final    Comment: (NOTE) The Xpert SA Assay (FDA approved for NASAL specimens in patients 27 years of age and older), is one component of a comprehensive surveillance program. It is not intended to diagnose infection nor to guide or monitor treatment. Performed at [facility], [address] [place], [place] [address]          Radiology Studies: No results found.      Scheduled Meds:  B-complex with vitamin C  1 tablet Oral Daily   enoxaparin (LOVENOX) injection  40 mg Subcutaneous Q24H   feeding supplement  237 mL Oral BID BM   Continuous Infusions:  methocarbamol  (ROBAXIN) IV 500 mg (04/25/23 0038)     LOS: 9 days    Time spent: 35 minutes    Jonalyn Sedlak A Ingrid Shifrin, MD [facility]   If 7PM-7AM, please contact night-coverage [email or web address]  04/27/2023, 3:37 PM

## 2023-04-28 DIAGNOSIS — K56609 Unspecified intestinal obstruction, unspecified as to partial versus complete obstruction: Secondary | ICD-10-CM | POA: Diagnosis not present

## 2023-04-28 MED ORDER — OXYCODONE HCL 5 MG PO TABS: 5.0000 mg | ORAL_TABLET | Freq: Four times a day (QID) | ORAL | 0 refills | Status: AC | PRN

## 2023-04-28 MED ORDER — B COMPLEX-C PO TABS: 1.0000 | ORAL_TABLET | Freq: Every day | ORAL | 0 refills | Status: AC

## 2023-04-28 MED ORDER — ACETAMINOPHEN 500 MG PO TABS: 500.0000 mg | ORAL_TABLET | Freq: Four times a day (QID) | ORAL | 0 refills | Status: AC | PRN

## 2023-04-28 MED ORDER — AMLODIPINE BESYLATE 5 MG PO TABS: 5.0000 mg | ORAL_TABLET | Freq: Every day | ORAL | 0 refills | Status: AC

## 2023-04-28 NOTE — Progress Notes (Signed)
    5 Days Post-Op  Subjective: Some incisional pain. Tolerating solid food, had a BM this morning. Ambulating without difficulty.  Objective: Vital signs in last 24 hours: Temp:  [98.1 F (36.7 C)-98.7 F (37.1 C)] 98.1 F (36.7 C) (08/10 0618) Pulse Rate:  [79-103] 82 (08/10 0618) Resp:  [17-18] 18 (08/10 0618) BP: (126-157)/(93-99) 126/93 (08/10 0618) SpO2:  [96 %-100 %] 98 % (08/10 0618) Last BM Date : 04/26/23  Intake/Output from previous day: 08/09 0701 - 08/10 0700 In: 2440.8 [P.O.:2160; I.V.:280.8] Out: 2000 [Urine:2000] Intake/Output this shift: Total I/O In: 120 [P.O.:120] Out: -   PE: Gen:  Alert, NAD, pleasant Abd: Soft, nondistended, lower midline incision clean and dry with staples in place, honeycomb dressing removed.  Lab Results:  Recent Labs    04/26/23 0853 04/27/23 0435  WBC 9.6 8.7  HGB 11.8* 12.1  HCT 37.2 37.7  PLT 250 256   BMET Recent Labs    04/26/23 0853 04/27/23 0435  NA 138 134*  K 3.8 3.5  CL 106 102  CO2 21* 22  GLUCOSE 93 110*  BUN 11 8  CREATININE 0.77 0.79  CALCIUM 8.4* 8.1*   PT/INR No results for input(s): "LABPROT", "INR" in the last 72 hours. CMP     Component Value Date/Time   NA 134 (L) 04/27/2023 0435   K 3.5 04/27/2023 0435   CL 102 04/27/2023 0435   CO2 22 04/27/2023 0435   GLUCOSE 110 (H) 04/27/2023 0435   BUN 8 04/27/2023 0435   CREATININE 0.79 04/27/2023 0435   CALCIUM 8.1 (L) 04/27/2023 0435   PROT 6.2 (L) 04/22/2023 1814   ALBUMIN 3.0 (L) 04/22/2023 1814   AST 33 04/22/2023 1814   ALT 22 04/22/2023 1814   ALKPHOS 61 04/22/2023 1814   BILITOT 1.0 04/22/2023 1814   GFRNONAA >60 04/27/2023 0435   GFRAA >60 03/20/2020 0514   Lipase     Component Value Date/Time   LIPASE 43 04/17/2023 2159    Studies/Results: No results found.    Assessment/Plan POD 5 s/p exploratory laparotomy, LOA for SBO by Dr. Magnus Ivan on 04/23/23 - Intra-op findings: The patient was found to have a single thickened  adhesive band creating a closed-loop small bowel obstruction. There was no evidence of bowel ischemia.  - Continue soft diet - Mobilize, PT - Pulm toilet - Ok for discharge home today from a surgical standpoint   LOS: 10 days    Fritzi Mandes, MD [facility] Surgery 04/28/2023, 10:11 AM Please see Amion for pager number during day hours 7:00am-4:30pm

## 2023-04-28 NOTE — Plan of Care (Signed)
  Problem: Education: Goal: Knowledge of General Education information will improve Description: Including pain rating scale, medication(s)/side effects and non-pharmacologic comfort measures 04/28/2023 1253 by Diona Browner, RN Outcome: Adequate for Discharge 04/28/2023 1253 by Diona Browner, RN Outcome: Adequate for Discharge   Problem: Health Behavior/Discharge Planning: Goal: Ability to manage health-related needs will improve 04/28/2023 1253 by Diona Browner, RN Outcome: Adequate for Discharge 04/28/2023 1253 by Diona Browner, RN Outcome: Adequate for Discharge   Problem: Clinical Measurements: Goal: Ability to maintain clinical measurements within normal limits will improve 04/28/2023 1253 by Diona Browner, RN Outcome: Adequate for Discharge 04/28/2023 1253 by Diona Browner, RN Outcome: Adequate for Discharge Goal: Will remain free from infection 04/28/2023 1253 by Diona Browner, RN Outcome: Adequate for Discharge 04/28/2023 1253 by Diona Browner, RN Outcome: Adequate for Discharge Goal: Diagnostic test results will improve 04/28/2023 1253 by Diona Browner, RN Outcome: Adequate for Discharge 04/28/2023 1253 by Diona Browner, RN Outcome: Adequate for Discharge Goal: Respiratory complications will improve 04/28/2023 1253 by Diona Browner, RN Outcome: Adequate for Discharge 04/28/2023 1253 by Diona Browner, RN Outcome: Adequate for Discharge Goal: Cardiovascular complication will be avoided 04/28/2023 1253 by Diona Browner, RN Outcome: Adequate for Discharge 04/28/2023 1253 by Diona Browner, RN Outcome: Adequate for Discharge   Problem: Activity: Goal: Risk for activity intolerance will decrease 04/28/2023 1253 by Diona Browner, RN Outcome: Adequate for Discharge 04/28/2023 1253 by Diona Browner, RN Outcome: Adequate for Discharge   Problem: Nutrition: Goal: Adequate nutrition will be maintained 04/28/2023 1253 by Diona Browner,  RN Outcome: Adequate for Discharge 04/28/2023 1253 by Diona Browner, RN Outcome: Adequate for Discharge   Problem: Coping: Goal: Level of anxiety will decrease 04/28/2023 1253 by Diona Browner, RN Outcome: Adequate for Discharge 04/28/2023 1253 by Diona Browner, RN Outcome: Adequate for Discharge   Problem: Elimination: Goal: Will not experience complications related to bowel motility 04/28/2023 1253 by Diona Browner, RN Outcome: Adequate for Discharge 04/28/2023 1253 by Diona Browner, RN Outcome: Adequate for Discharge Goal: Will not experience complications related to urinary retention 04/28/2023 1253 by Diona Browner, RN Outcome: Adequate for Discharge 04/28/2023 1253 by Diona Browner, RN Outcome: Adequate for Discharge   Problem: Pain Managment: Goal: General experience of comfort will improve 04/28/2023 1253 by Diona Browner, RN Outcome: Adequate for Discharge 04/28/2023 1253 by Diona Browner, RN Outcome: Adequate for Discharge   Problem: Safety: Goal: Ability to remain free from injury will improve 04/28/2023 1253 by Diona Browner, RN Outcome: Adequate for Discharge 04/28/2023 1253 by Diona Browner, RN Outcome: Adequate for Discharge   Problem: Skin Integrity: Goal: Risk for impaired skin integrity will decrease 04/28/2023 1253 by Diona Browner, RN Outcome: Adequate for Discharge 04/28/2023 1253 by Diona Browner, RN Outcome: Adequate for Discharge

## 2023-04-28 NOTE — Progress Notes (Signed)
Reviewed written d/c instructions w pt and all questions answered. She verbalized understanding. D/C via w/c w all belongings in stable condition. 

## 2023-04-28 NOTE — Progress Notes (Signed)
Mobility Specialist - Progress Note   04/28/23 0841  Mobility  Activity Ambulated independently in hallway  Level of Assistance Independent  Assistive Device None  Distance Ambulated (ft) 280 ft  Activity Response Tolerated well  Mobility Referral Yes  $Mobility charge 1 Mobility  Mobility Specialist Start Time (ACUTE ONLY) 0834  Mobility Specialist Stop Time (ACUTE ONLY) 0840  Mobility Specialist Time Calculation (min) (ACUTE ONLY) 6 min   Pt received in bed and agreeable to mobility. No complaints during session. Pt to bed after session with all needs met.    [facility]

## 2023-04-28 NOTE — Discharge Summary (Signed)
Physician Discharge Summary   Patient: Beth Stone MRN: [id number] DOB: June 14, 1951  Admit date:     04/17/2023  Discharge date: 04/28/23  Discharge Physician: Alba Cory   PCP: Ihor Gully, MD   Recommendations at discharge:    Needs Follow up on BP, adjust medications as needed.  Needs to follow up with Surgery for post op follow up.   Discharge Diagnoses: Principal Problem:   SBO (small bowel obstruction) (HCC) Active Problems:   Enteritis   Protein-calorie malnutrition, severe  Resolved Problems:   * No resolved hospital problems. *  Hospital Course: 72 year old with past medical history significant for hypertension, prior hysterectomy and inguinal hernia repair x 2 admitted for SBO.  Presented with diffuse abdominal pain, CT abdomen and pelvis concerning for possible partial small bowel obstruction.  Patient did not respond to conservative management, she require exploratory laparotomy on 8/5 by  general surgery.      Assessment and Plan: SBO, status post exploratory laparotomy, lysis of adhesions on 04/23/2023 by Dr. Magnus Ivan: -Prior history of hysterectomy and bilateral inguinal hernia repair, last colonoscopy 2020.   -X-ray 8/1 showed no contrast in the colon, NG tube inserted.  Fail to improved conservatively. -Underwent exploratory laparotomy and lysis of adhesion on 04/23/2023. -Continue IV fluid, continue electrolytes replacement as indicated -NG tube  removed 8/07 -She had multiples BM 8/09 Tolerating soft diet. Stable for discharge.     Essential hypertension: Elevated. On PRN hydralazine.  Started  Low dose Norvasc.    Leukocytosis, suspect reactive postsurgery. Closely monitor fever curve and WBCs Trending down. Afebrile.    Hypokalemia; Replaced.  Hypomagnesemia; Replaced.    Estimated body mass index is 27.97 kg/m as calculated from the following:   Height as of this encounter: 5\' 7"  (1.702 m).   Weight as of this encounter: 81  kg.            Consultants: General Surgery  Procedures performed: status post exploratory laparotomy, lysis of adhesions on 04/23/2023 by Dr. Magnus Ivan: Disposition: Home Diet recommendation:  Cardiac diet DISCHARGE MEDICATION: Allergies as of 04/28/2023       Reactions   Atorvastatin Other (See Comments)   Myalgia   Penicillins Hives, Rash   Rosuvastatin Other (See Comments)   Tingling in feet        Medication List     STOP taking these medications    acetaminophen 650 MG CR tablet Commonly known as: TYLENOL Replaced by: acetaminophen 500 MG tablet       TAKE these medications    acetaminophen 500 MG tablet Commonly known as: TYLENOL Take 1 tablet (500 mg total) by mouth every 6 (six) hours as needed for fever or mild pain. Replaces: acetaminophen 650 MG CR tablet   amLODipine 5 MG tablet Commonly known as: NORVASC Take 1 tablet (5 mg total) by mouth daily. Start taking on: April 29, 2023   aspirin 81 MG tablet Take 81 mg by mouth daily.   B-complex with vitamin C tablet Take 1 tablet by mouth daily. Start taking on: April 29, 2023   multivitamin with minerals tablet Take 1 tablet by mouth daily.   oxyCODONE 5 MG immediate release tablet Commonly known as: Oxy IR/ROXICODONE Take 1 tablet (5 mg total) by mouth every 6 (six) hours as needed for up to 2 days for severe pain or moderate pain.   REFRESH OP Place 1 drop into both eyes as needed (dry eye).        Follow-up  Information     Powers, Shireen Quan, MD Follow up in 1 week(s).   Specialty: Family Medicine Contact information: [address] [place] [address] [phone]         Abigail Miyamoto, MD Follow up in 1 week(s).   Specialty: General Surgery Why: call office to arrange follow up Contact information: [address] [place] [address] [phone]                Discharge Exam: Ceasar Mons Weights   04/20/23 0507 04/23/23  0948  Weight: 81 kg 81 kg   General; NAD Abdomen; Midline wound with staple.   Condition at discharge: stable  The results of significant diagnostics from this hospitalization (including imaging, microbiology, ancillary and laboratory) are listed below for reference.   Imaging Studies: DG Abd Portable 1V  Result Date: 04/22/2023 CLINICAL DATA:  Small bowel obstruction. EXAM: PORTABLE ABDOMEN - 1 VIEW COMPARISON:  04/21/2023 FINDINGS: NG tube tip again noted in the stomach. Persistent gaseous small bowel distension noted in the abdomen with small bowel loops measuring up to 4 cm diameter, similar to prior. No definite colonic gas evident. IMPRESSION: Persistent gaseous small bowel distension without definite colonic gas. Electronically Signed   By: Kennith Center M.D.   On: 04/22/2023 09:42   DG Abd Portable 1V  Result Date: 04/21/2023 CLINICAL DATA:  Follow-up small bowel obstruction. EXAM: PORTABLE ABDOMEN - 1 VIEW COMPARISON:  04/20/2023 FINDINGS: The nasogastric tube tip and side port are in the gastric fundus. Persistent dilated loops of small bowel are noted within the central abdomen measuring up to 4.4 cm. No new findings. IMPRESSION: Persistent small bowel obstruction pattern. Electronically Signed   By: Signa Kell M.D.   On: 04/21/2023 09:23   DG Abd Portable 1V  Result Date: 04/20/2023 CLINICAL DATA:  Small bowel obstruction. EXAM: PORTABLE ABDOMEN - 1 VIEW COMPARISON:  04/19/2023 FINDINGS: There is an enteric tube with tip and side port within the stomach. Dilated small bowel loops within the central abdomen are again noted and appear unchanged. Paucity of colonic gas. No new findings. IMPRESSION: 1. Enteric tube tip and side port within the stomach. 2. Unchanged dilated small bowel loops within the central abdomen. Electronically Signed   By: Signa Kell M.D.   On: 04/20/2023 08:16   DG Abd Portable 1V  Result Date: 04/19/2023 CLINICAL DATA:  [id number] Encounter for imaging study  to confirm nasogastric (NG) tube placement [id number] EXAM: PORTABLE ABDOMEN - 1 VIEW COMPARISON:  April 18, 2023, April 17, 2023 FINDINGS: Incomplete visualization of the pelvis. Enteric tube tip and side port project over the stomach. Mildly dilated loops of small bowel in the mid abdomen measuring approximately 3.6 cm. This is similar in comparison to prior CT. Enteric contrast delineates the stomach and several loops of small bowel. IMPRESSION: Enteric tube tip and side port project over the stomach. Similar dilation of small bowel likely reflecting obstruction. Electronically Signed   By: Meda Klinefelter M.D.   On: 04/19/2023 14:51   DG Abd Portable 1V-Small Bowel Obstruction Protocol-initial, 8 hr delay  Result Date: 04/19/2023 CLINICAL DATA:  Small-bowel obstruction 8 hour delay EXAM: PORTABLE ABDOMEN - 1 VIEW COMPARISON:  CT 04/17/2023 FINDINGS: Large contrast collection in the proximal stomach. Multiple dilated loops of small bowel up to 3.6 cm. Possible dilute contrast within dilated small bowel. No contrast in the colon. IMPRESSION: Large contrast collection in the proximal stomach. Multiple dilated loops of small bowel with  possible dilute contrast within dilated small bowel. No contrast in the colon. Findings suspicious for small bowel obstruction Electronically Signed   By: Jasmine Pang M.D.   On: 04/19/2023 01:04   CT ABDOMEN PELVIS W CONTRAST  Result Date: 04/18/2023 CLINICAL DATA:  Abdominal pain. EXAM: CT ABDOMEN AND PELVIS WITH CONTRAST TECHNIQUE: Multidetector CT imaging of the abdomen and pelvis was performed using the standard protocol following bolus administration of intravenous contrast. RADIATION DOSE REDUCTION: This exam was performed according to the departmental dose-optimization program which includes automated exposure control, adjustment of the mA and/or kV according to patient size and/or use of iterative reconstruction technique. CONTRAST:  80mL OMNIPAQUE IOHEXOL 300 MG/ML   SOLN COMPARISON:  None Available. FINDINGS: Lower chest: The visualized lung bases are clear. No intra-abdominal free air. Small free fluid in the abdomen pelvis. Hepatobiliary: The liver is unremarkable. No biliary dilatation. The gallbladder is unremarkable. Pancreas: Unremarkable. No pancreatic ductal dilatation or surrounding inflammatory changes. Spleen: Normal in size without focal abnormality. Adrenals/Urinary Tract: The adrenal glands unremarkable. There is no hydronephrosis on either side. There is symmetric enhancement and excretion of contrast by both kidneys. The visualized ureters and urinary bladder appear unremarkable. Stomach/Bowel: Small hiatal hernia. Multiple top normal caliber fluid-filled loops of small bowel measure up to 2.8 cm in caliber. There is stranding and edema in the associated mesentery. The terminal ileum and a loop of distal small bowel in the pelvis are collapsed. Findings favored to represent enteritis with associated ileus. Developing obstruction with transition in distal ileum in the right hemipelvis is not excluded. Clinical correlation is recommended. A small-bowel series may provide better evaluation. There is sigmoid diverticulosis without active inflammatory changes. The appendix is normal. Vascular/Lymphatic: Mild aortoiliac atherosclerotic disease. The IVC is unremarkable. No portal venous gas. There is no adenopathy. Reproductive: Hysterectomy.  No adnexal masses. Other: None Musculoskeletal: Degenerative changes of the spine. No acute osseous pathology. IMPRESSION: 1. Findings favored to represent enteritis with associated ileus. Developing obstruction is not excluded. Small-bowel series may provide better evaluation. 2. Sigmoid diverticulosis. Normal appendix. 3.  Aortic Atherosclerosis (ICD10-I70.0). Electronically Signed   By: Elgie Collard M.D.   On: 04/18/2023 00:13    Microbiology: Results for orders placed or performed during the hospital encounter of  04/17/23  Surgical pcr screen     Status: None   Collection Time: 04/22/23  8:48 PM   Specimen: Nasal Mucosa; Nasal Swab  Result Value Ref Range Status   MRSA, PCR NEGATIVE NEGATIVE Final   Staphylococcus aureus NEGATIVE NEGATIVE Final    Comment: (NOTE) The Xpert SA Assay (FDA approved for NASAL specimens in patients 39 years of age and older), is one component of a comprehensive surveillance program. It is not intended to diagnose infection nor to guide or monitor treatment. Performed at [facility], [address], [place], [place] [address]     Labs: CBC: Recent Labs  Lab 04/23/23 [id number] 04/24/23 0435 04/25/23 0424 04/26/23 0853 04/27/23 0435  WBC 11.0* 15.0* 12.7* 9.6 8.7  HGB 13.5 12.4 11.6* 11.8* 12.1  HCT 42.5 37.4 36.6 37.2 37.7  MCV 92.8 90.8 93.1 93.0 93.5  PLT 245 246 245 250 256   Basic Metabolic Panel: Recent Labs  Lab 04/23/23 0419 04/24/23 0435 04/25/23 0424 04/26/23 0853 04/27/23 0435  NA 138 137 136 138 134*  K 3.4* 3.8 3.6 3.8 3.5  CL 103 101 102 106 102  CO2 23 25 25  21* 22  GLUCOSE 86 120* 91 93  110*  BUN 17 16 15 11 8   CREATININE 0.83 0.75 0.88 0.77 0.79  CALCIUM 8.4* 8.4* 8.2* 8.4* 8.1*  MG  --   --  1.8 2.1  --   PHOS  --   --  2.5  --   --    Liver Function Tests: Recent Labs  Lab 04/22/23 1814  AST 33  ALT 22  ALKPHOS 61  BILITOT 1.0  PROT 6.2*  ALBUMIN 3.0*   CBG: No results for input(s): "GLUCAP" in the last 168 hours.  Discharge time spent: greater than 30 minutes.  Signed: Alba Cory, MD [facility] 04/28/2023

## 2023-04-28 NOTE — Discharge Instructions (Addendum)
[facility] DISCHARGE INSTRUCTIONS  Activity No heavy lifting greater than 15 pounds for 8 weeks after surgery. Ok to shower, but do not bathe or submerge incisions underwater. Do not drive while taking narcotic pain medication. You may drive when you are no longer taking prescription pain medication, you can comfortably wear a seatbelt, and you can safely maneuver your car and apply brakes.  Wound Care Your incisions are covered with skin glue called Dermabond. This will peel off on its own over time. You may shower and allow warm soapy water to run over your incisions. Gently pat dry. Do not submerge your incision underwater until cleared by your surgeon. Monitor your incision for any new redness, tenderness, or drainage. Many patients will experience some swelling and bruising at the incisions.  Ice packs will help.  Swelling and bruising can take several days to resolve.   Medications A  prescription for pain medication may be given to you upon discharge.  Take your pain medication as prescribed, if needed.  If narcotic pain medicine is not needed, then you may take acetaminophen (Tylenol) or ibuprofen (Advil) as needed. It is common to experience some constipation if taking pain medication after surgery.  Increasing fluid intake and taking a stool softener (such as Colace) will usually help or prevent this problem from occurring.  A mild laxative (Milk of Magnesia or Miralax) should be taken according to package directions if there are no bowel movements after 48 hours. Take your usually prescribed medications unless otherwise directed. If you need a refill on your pain medication, please contact your pharmacy.  They will contact our office to request authorization. Prescriptions will not be filled after 5 pm or on weekends.  When to Call us: Fever greater than 100.5 New redness, drainage, or swelling at incision site Severe pain, nausea, or vomiting Persistent bleeding  from incisions  Follow-up You will have a follow up appointment scheduled to remove your staples, and will also have a follow up appointment with Dr. Magnus Ivan (your surgeon). These appointments will be at the [facility] office at [address], [place], [place]. Please arrive at least 15 minutes prior to your scheduled appointment time. Our office will contact you to set up these appointments, or you may call the office at the number below.  IF YOU HAVE DISABILITY OR FAMILY LEAVE FORMS, YOU MUST BRING THEM TO THE OFFICE FOR PROCESSING.   DO NOT GIVE THEM TO YOUR DOCTOR.  The clinic staff is available to answer your questions during regular business hours.  Please don't hesitate to call and ask to speak to one of the nurses for clinical concerns.  If you have a medical emergency, go to the nearest emergency room or call 911.  A surgeon from [facility] is always on call at the hospital  [address], [address], [place], [place]  [address] ?  [address] [address], [place], [place]   [address] [phone] ? Toll Free: [phone] ? FAX [phone] Web site: [email or web address]  EATING AFTER A SMALL BOWEL OBSTRUCTION   EAT START WITH PUREED OR SOFT FOODS Gradually transition to a high fiber diet with a fiber supplement over the next few days after discharge  WALK Walk an hour a day.  Control your pain to do that.    CONTROL PAIN Control pain so that you can walk, sleep, tolerate sneezing/coughing, go up/down stairs.  HAVE A BOWEL MOVEMENT DAILY Keep your bowels regular to  avoid problems.  OK to try a laxative to override constipation.  OK to use an antidairrheal to slow down diarrhea.  Call if not better after 2 tries  CALL IF YOU HAVE PROBLEMS/CONCERNS Call if you are still struggling despite following these instructions. Call if you have concerns not answered by these instructions     After your attack of SMALL BOWEL  OBSTRUCTION, expect some issues over the next few weeks.    To help you through this temporary phase, we start you out on a pureed (blenderized) diet.  Your first meal in the hospital was thin liquids.  You should have been given a pureed diet by the time you left the hospital.  We ask patients to stay on a pureed diet for the first few days to avoid anything getting "stuck."  Don't be alarmed if your ability to swallow doesn't progress according to this plan.  Everyone is different and some diets can advance more or less quickly.     Some BASIC RULES to follow are: Maintain an upright position whenever eating or drinking. Take small bites - just a teaspoon size bite at a time. Eat slowly.  It may also help to eat only one food at a time. Consider nibbling through smaller, more frequent meals & avoid the urge to eat BIG meals Do not push through feelings of fullness, nausea, or bloatedness Do not mix solid foods and liquids in the same mouthful Try not to "wash foods down" with large gulps of liquids. Avoid carbonated (bubbly/fizzy) drinks.   Avoid foods that make you feel gassy or bloated.  Start with bland foods first.  Wait on trying greasy, fried, or spicy meals until you are tolerating more bland solids well. Expect to be more gassy/flatulent/bloated initially.  Walking will help your body manage it better. Consider using medications for bloating that contain simethicone such as  Maalox or Gas-X  Eat in a relaxed atmosphere & minimize distractions. Avoid talking while eating.   Do not use straws. Following each meal, sit in an upright position (90 degree angle) for 60 to 90 minutes.  Going for a short walk can help as well If food does stick, don't panic.  Try to relax and let the food pass on its own.  Sipping WARM LIQUID such as strong hot black tea can also help slide it down.   Be gradual in changes & use common sense:  -If you easily tolerating a certain "level" of foods,  advance to the next level gradually -If you are having trouble swallowing a particular food, then avoid it.   -If food is sticking when you advance your diet, go back to thinner previous diet (the lower LEVEL) for 1-2 days.  LEVEL 1 = PUREED DIET  Do for the first FEW DAYS AFTER LEAVING THE HOSPITAL  -Foods in this group are pureed or blenderized to a smooth, mashed potato-like consistency.  -If necessary, the pureed foods can keep their shape with the addition of a thickening agent.   -Meat should be pureed to a smooth, pasty consistency.  Hot broth or gravy may be added to the pureed meat, approximately 1 oz. of liquid per 3 oz. serving of meat. -CAUTION:  If any foods do not puree into a smooth consistency, swallowing will be more difficult.  (For example, nuts or seeds sometimes do not blend well.)  Hot Foods Cold Foods  Pureed scrambled eggs and cheese Pureed cottage cheese  Baby cereals Thickened juices and nectars  Thinned cooked cereals (no lumps) Thickened milk or eggnog  Pureed [place] toast or pancakes Ensure  Mashed potatoes Ice cream  Pureed parsley, au gratin, scalloped potatoes, candied sweet potatoes Fruit or [place] ice, sherbet  Pureed buttered or alfredo noodles Plain yogurt  Pureed vegetables (no corn or peas) Instant breakfast  Pureed soups and creamed soups Smooth pudding, mousse, custard  Pureed scalloped apples Whipped gelatin  Gravies Sugar, syrup, honey, jelly  Sauces, cheese, tomato, barbecue, white, creamed Cream  Any baby food Creamer  Alcohol in moderation (not beer or champagne) Margarine  Coffee or tea Mayonnaise   Ketchup, mustard   Apple sauce   SAMPLE MENU:  PUREED DIET Breakfast Lunch Dinner  Orange juice, 1/2 cup Cream of wheat, 1/2 cup Pineapple juice, 1/2 cup Pureed [place], barley soup, 3/4 cup Pureed Hawaiian chicken, 3 oz  Scrambled eggs, mashed or blended with cheese, 1/2 cup Tea or coffee, 1 cup  Whole milk, 1 cup  Non-dairy creamer, 2  Tbsp. Mashed potatoes, 1/2 cup Pureed cooled broccoli, 1/2 cup Apple sauce, 1/2 cup Coffee or tea Mashed potatoes, 1/2 cup Pureed spinach, 1/2 cup Frozen yogurt, 1/2 cup Tea or coffee      LEVEL 2 = SOFT DIET  After your first few days, you can advance to a soft, low residue diet.   Keep on this diet until everything goes down easily.  Hot Foods Cold Foods  White fish Cottage cheese  Stuffed fish Junior baby fruit  Baby food meals Semi thickened juices  Minced soft cooked, scrambled, poached eggs nectars  Souffle & omelets Ripe mashed bananas  Cooked cereals Canned fruit, pineapple sauce, milk  potatoes Milkshake  Buttered or Alfredo noodles Custard  Cooked cooled vegetable Puddings, including tapioca  Sherbet Yogurt  Vegetable soup or alphabet soup Fruit ice, [place] ice  Gravies Whipped gelatin  Sugar, syrup, honey, jelly Junior baby desserts  Sauces:  Cheese, creamed, barbecue, tomato, white Cream  Coffee or tea Margarine   SAMPLE MENU:  LEVEL 2 Breakfast Lunch Dinner  Orange juice, 1/2 cup Oatmeal, 1/2 cup Scrambled eggs with cheese, 1/2 cup Decaffeinated tea, 1 cup Whole milk, 1 cup Non-dairy creamer, 2 Tbsp Pineapple juice, 1/2 cup Minced beef, 3 oz Gravy, 2 Tbsp Mashed potatoes, 1/2 cup Minced fresh broccoli, 1/2 cup Applesauce, 1/2 cup Coffee, 1 cup [place], barley soup, 3/4 cup Minced Hawaiian chicken, 3 oz Mashed potatoes, 1/2 cup Cooked spinach, 1/2 cup Frozen yogurt, 1/2 cup Non-dairy creamer, 2 Tbsp      LEVEL 3 = CHOPPED DIET  -After all the foods in level 2 (soft diet) are passing through well you should advance up to more chopped foods.  -It is still important to cut these foods into small pieces and eat slowly.  Hot Foods Cold Foods  Poultry Cottage cheese  Chopped Swedish meatballs Yogurt  Meat salads (ground or flaked meat) Milk  Flaked fish (tuna) Milkshakes  Poached or scrambled eggs Soft, cold, dry cereal  Souffles and omelets  Fruit juices or nectars  Cooked cereals Chopped canned fruit  Chopped [place] toast or pancakes Canned fruit cocktail  Noodles or pasta (no rice) Pudding, mousse, custard  Cooked vegetables (no frozen peas, corn, or mixed vegetables) Green salad  Canned small sweet peas Ice cream  Creamed soup or vegetable soup Fruit ice, [place] ice  Pureed vegetable soup or alphabet soup Non-dairy creamer  Ground scalloped apples Margarine  Gravies Mayonnaise  Sauces:  Cheese, creamed, barbecue, tomato, white Ketchup  Coffee  or tea Mustard   SAMPLE MENU:  LEVEL 3 Breakfast Lunch Dinner  Orange juice, 1/2 cup Oatmeal, 1/2 cup Scrambled eggs with cheese, 1/2 cup Decaffeinated tea, 1 cup Whole milk, 1 cup Non-dairy creamer, 2 Tbsp Ketchup, 1 Tbsp Margarine, 1 tsp Salt, 1/4 tsp Sugar, 2 tsp Pineapple juice, 1/2 cup Ground beef, 3 oz Gravy, 2 Tbsp Mashed potatoes, 1/2 cup Cooked spinach, 1/2 cup Applesauce, 1/2 cup Decaffeinated coffee Whole milk Non-dairy creamer, 2 Tbsp Margarine, 1 tsp Salt, 1/4 tsp Pureed [place], barley soup, 3/4 cup Barbecue chicken, 3 oz Mashed potatoes, 1/2 cup Ground fresh broccoli, 1/2 cup Frozen yogurt, 1/2 cup Decaffeinated tea, 1 cup Non-dairy creamer, 2 Tbsp Margarine, 1 tsp Salt, 1/4 tsp Sugar, 1 tsp    LEVEL 4:  HIGH FIBER DIET / REGULAR FOODS  -Foods in this group are soft, moist, regularly textured foods.   -This level includes meat and breads, which tend to be the hardest things to swallow.   -Eat very slowly, chew well and continue to avoid carbonated drinks. -most people are at this level in 2-4 weeks  Hot Foods Cold Foods  Baked fish or skinned Soft cheeses - cottage cheese  Souffles and omelets Cream cheese  Eggs Yogurt  Stuffed shells Milk  Spaghetti with meat sauce Milkshakes  Cooked cereal Cold dry cereals (no nuts, dried fruit, coconut)  [place] toast or pancakes Crackers  Buttered toast Fruit juices or nectars  Noodles or pasta (no  rice) Canned fruit  Potatoes (all types) Ripe bananas  Soft, cooked vegetables (no corn, lima, or baked beans) Peeled, ripe, fresh fruit  Creamed soups or vegetable soup Cakes (no nuts, dried fruit, coconut)  Canned chicken noodle soup Plain doughnuts  Gravies Ice cream  Bacon dressing Pudding, mousse, custard  Sauces:  Cheese, creamed, barbecue, tomato, white Fruit ice, [place] ice, sherbet  Decaffeinated tea or coffee Whipped gelatin  Pork chops Regular gelatin   Canned fruited gelatin molds   Sugar, syrup, honey, jam, jelly   Cream   Non-dairy   Margarine   Oil   Mayonnaise   Ketchup   Mustard   TROUBLESHOOTING IRREGULAR BOWELS  1) Avoid extremes of bowel movements (no bad constipation/diarrhea)  2) Miralax 17gm mixed in 8oz. water or juice-daily. May use BID as needed.  3) Gas-x,Phazyme, etc. as needed for gas & bloating.  4) Soft,bland diet. No spicy,greasy,fried foods.  5) Prilosec over-the-counter as needed  6) May hold gluten/wheat products from diet to see if symptoms improve.  7) May try probiotics (Align, Activa, etc) to help calm the bowels down  7) If symptoms become worse call back immediately.    If you have any questions please call our office at [facility]: [phone].     This information is not intended to replace advice given to you by your health care provider. Make sure you discuss any questions you have with your health care provider.      Bowel Obstruction A bowel obstruction is a blockage in the small or large bowel. The bowel, which is also called the intestine, is a long, slender tube that connects the stomach to the anus. When a person eats and drinks, food and fluids go from the mouth to the stomach to the small bowel. This is where most of the nutrients in the food and fluids are absorbed. After the small bowel, material passes through the large bowel for further absorption until any leftover material leaves the body as stool  through  the anus during a bowel movement. A bowel obstruction will prevent food and fluids from passing through the bowel as they normally do during digestion. The bowel can become partially or completely blocked. If this condition is not treated, it can be dangerous because the bowel could rupture. What are the causes? Common causes of this condition include: Scar tissue (adhesions) from previous surgery or treatment with high-energy X-rays (radiation). Recent surgery. This may cause the movements of the bowel to slow down and cause food to block the intestine. Inflammatory bowel disease, such as Crohn's disease or diverticulitis. Growths or tumors. A bulging organ (hernia). Twisting of the bowel (volvulus). A foreign body. Slipping of a part of the bowel into another part (intussusception). What are the signs or symptoms? Symptoms of this condition include: Pain in the abdomen. Depending on the degree of obstruction, pain may be: Mild or severe. Dull cramping or sharp pain. In one area or in the entire abdomen. Nausea and vomiting. Vomit may be greenish or a yellow bile color. Bloating in the abdomen. Difficulty passing stool (constipation). Lack of passing gas. Frequent belching. Diarrhea. This may occur if the obstruction is partial and runny stool is able to leak around the obstruction. How is this diagnosed? This condition may be diagnosed based on: A physical exam. Medical history. Imaging tests of the abdomen or pelvis, such as X-ray or CT scan. Blood or urine tests. How is this treated? Treatment for this condition depends on the cause and severity of the problem. Treatment may include: Fluids and pain medicines that are given through an IV. Your health care provider may instruct you not to eat or drink if you have nausea or vomiting. Eating a simple diet. You may be asked to consume a clear liquid diet for several days. This allows the bowel to rest. Placement of a small  tube (nasogastric tube) into the stomach. This will relieve pain, discomfort, and nausea by removing blocked air and fluids from the stomach. It can also help the obstruction clear up faster. Surgery. This may be required if other treatments do not work. Surgery may be required for: Bowel obstruction from a hernia. This can be an emergency procedure. Scar tissue that causes frequent or severe obstructions. Follow these instructions at home: Medicines Take over-the-counter and prescription medicines only as told by your health care provider. If you were prescribed an antibiotic medicine, take it as told by your health care provider. Do not stop taking the antibiotic even if you start to feel better. General instructions Follow instructions from your health care provider about eating restrictions. You may need to avoid solid foods and consume only clear liquids until your condition improves. Return to your normal activities as told by your health care provider. Ask your health care provider what activities are safe for you. Avoid sitting for a long time without moving. Get up to take short walks every 1-2 hours. This is important to improve blood flow and breathing. Ask for help if you feel weak or unsteady. Keep all follow-up visits as told by your health care provider. This is important. How is this prevented? After having a bowel obstruction, you are more likely to have another. You may do the following things to prevent another obstruction: If you have a long-term (chronic) disease, pay attention to your symptoms and contact your health care provider if you have questions or concerns. Avoid becoming constipated. To prevent or treat constipation, your health care provider may recommend that you: Drink  enough fluid to keep your urine pale yellow. Take over-the-counter or prescription medicines. Eat foods that are high in fiber, such as beans, whole grains, and fresh fruits and vegetables. Limit  foods that are high in fat and processed sugars, such as fried or sweet foods. Stay active. Exercise for 30 minutes or more, 5 or more days each week. Ask your health care provider which exercises are safe for you. Avoid stress. Find ways to reduce stress, such as meditation, exercise, or taking time for activities that relax you. Instead of eating three large meals each day, eat three small meals with three small snacks. Work with a Data processing manager to make a healthy meal plan that works for you. Do not use any products that contain nicotine or tobacco, such as cigarettes and e-cigarettes. If you need help quitting, ask your health care provider. Contact a health care provider if you: Have a fever. Have chills. Get help right away if you: Have increased pain or cramping. Vomit blood. Have uncontrolled vomiting or nausea. Cannot drink fluids because of vomiting or pain. Become confused. Begin feeling very thirsty (dehydrated). Have severe bloating. Feel extremely weak or you faint. Summary A bowel obstruction is a blockage in the small or large bowel. A bowel obstruction will prevent food and fluids from passing through the bowel as they normally do during digestion. Treatment for this condition depends on the cause and severity of the problem. It may include fluids and pain medicines through an IV, a simple diet, a nasogastric tube, or surgery. Follow instructions from your health care provider about eating restrictions. You may need to avoid solid foods and consume only clear liquids until your condition improves.      Managing Your Pain After Surgery Without Opioids    Thank you for participating in our program to help patients manage their pain after surgery without opioids. This is part of our effort to provide you with the best care possible, without exposing you or your family to the risk that opioids pose.  What pain can I expect after surgery? You can expect to have some pain  after surgery. This is normal. The pain is typically worse the day after surgery, and quickly begins to get better. Many studies have found that many patients are able to manage their pain after surgery with Over-the-Counter (OTC) medications such as Tylenol and Motrin. If you have a condition that does not allow you to take Tylenol or Motrin, notify your surgical team.  How will I manage my pain? The best strategy for controlling your pain after surgery is around the clock pain control with Tylenol (acetaminophen) and Motrin (ibuprofen or Advil). Alternating these medications with each other allows you to maximize your pain control. In addition to Tylenol and Motrin, you can use heating pads or ice packs on your incisions to help reduce your pain.  How will I alternate your regular strength over-the-counter pain medication? You will take a dose of pain medication every three hours. Start by taking 650 mg of Tylenol (2 pills of 325 mg) 3 hours later take 600 mg of Motrin (3 pills of 200 mg) 3 hours after taking the Motrin take 650 mg of Tylenol 3 hours after that take 600 mg of Motrin.   - 1 -  See example - if your first dose of Tylenol is at 12:00 PM   12:00 PM Tylenol 650 mg (2 pills of 325 mg)  3:00 PM Motrin 600 mg (3 pills of 200 mg)  6:00 PM Tylenol 650 mg (2 pills of 325 mg)  9:00 PM Motrin 600 mg (3 pills of 200 mg)  Continue alternating every 3 hours   We recommend that you follow this schedule around-the-clock for at least 3 days after surgery, or until you feel that it is no longer needed. Use the table on the last page of this handout to keep track of the medications you are taking. Important: Do not take more than 3000mg  of Tylenol or 3200mg  of Motrin in a 24-hour period. Do not take ibuprofen/Motrin if you have a history of bleeding stomach ulcers, severe kidney disease, &/or actively taking a blood thinner  What if I still have pain? If you have pain that is not  controlled with the over-the-counter pain medications (Tylenol and Motrin or Advil) you might have what we call "breakthrough" pain. You will receive a prescription for a small amount of an opioid pain medication such as Oxycodone, Tramadol, or Tylenol with Codeine. Use these opioid pills in the first 24 hours after surgery if you have breakthrough pain. Do not take more than 1 pill every 4-6 hours.  If you still have uncontrolled pain after using all opioid pills, don't hesitate to call our staff using the number provided. We will help make sure you are managing your pain in the best way possible, and if necessary, we can provide a prescription for additional pain medication.   Day 1    Time  Name of Medication Number of pills taken  Amount of Acetaminophen  Pain Level   Comments  AM PM       AM PM       AM PM       AM PM       AM PM       AM PM       AM PM       AM PM       Total Daily amount of Acetaminophen Do not take more than  3,000 mg per day      Day 2    Time  Name of Medication Number of pills taken  Amount of Acetaminophen  Pain Level   Comments  AM PM       AM PM       AM PM       AM PM       AM PM       AM PM       AM PM       AM PM       Total Daily amount of Acetaminophen Do not take more than  3,000 mg per day      Day 3    Time  Name of Medication Number of pills taken  Amount of Acetaminophen  Pain Level   Comments  AM PM       AM PM       AM PM       AM PM         AM PM       AM PM       AM PM       AM PM       Total Daily amount of Acetaminophen Do not take more than  3,000 mg per day      Day 4    Time  Name of Medication Number of pills taken  Amount of Acetaminophen  Pain Level   Comments  AM  PM       AM PM       AM PM       AM PM       AM PM       AM PM       AM PM       AM PM       Total Daily amount of Acetaminophen Do not take more than  3,000 mg per day      Day 5    Time  Name of Medication  Number of pills taken  Amount of Acetaminophen  Pain Level   Comments  AM PM       AM PM       AM PM       AM PM       AM PM       AM PM       AM PM       AM PM       Total Daily amount of Acetaminophen Do not take more than  3,000 mg per day      Day 6    Time  Name of Medication Number of pills taken  Amount of Acetaminophen  Pain Level  Comments  AM PM       AM PM       AM PM       AM PM       AM PM       AM PM       AM PM       AM PM       Total Daily amount of Acetaminophen Do not take more than  3,000 mg per day      Day 7    Time  Name of Medication Number of pills taken  Amount of Acetaminophen  Pain Level   Comments  AM PM       AM PM       AM PM       AM PM       AM PM       AM PM       AM PM       AM PM       Total Daily amount of Acetaminophen Do not take more than  3,000 mg per day        For additional information about how and where to safely dispose of unused opioid medications - [email or web address]  Disclaimer: This document contains information and/or instructional materials adapted from [facility] for the typical patient with your condition. It does not replace medical advice from your health care provider because your experience may differ from that of the typical patient. Talk to your health care provider if you have any questions about this document, your condition or your treatment plan. Adapted from [facility]  GETTING TO GOOD BOWEL HEALTH.  ######################################################################  EAT Gradually transition to a high fiber diet with a fiber supplement over the next few weeks after discharge.  Start with a pureed / full liquid diet (see below)  WALK Walk an hour a day.  Control your pain to do that.    HAVE A BOWEL MOVEMENT DAILY Keep your bowels regular to avoid problems.  OK to try a laxative to override constipation.  OK to use an antidairrheal to slow down  diarrhea.  Call if not better after 2 tries  CALL IF YOU HAVE PROBLEMS/CONCERNS Call if you are  still struggling despite following these instructions. Call if you have concerns not answered by these instructions  ######################################################################   Irregular bowel habits such as constipation and diarrhea can lead to many problems over time.  Having one soft bowel movement a day is the most important way to prevent further problems.  The anorectal canal is designed to handle stretching and feces to safely manage our ability to get rid of solid waste (feces, poop, stool) out of our body.  BUT, hard constipated stools can act like ripping concrete bricks and diarrhea can be a burning fire to this very sensitive area of our body, causing inflamed hemorrhoids, anal fissures, increasing risk is perirectal abscesses, abdominal pain/bloating, an making irritable bowel worse.      The goal: ONE SOFT BOWEL MOVEMENT A DAY!  To have soft, regular bowel movements:  Drink plenty of fluids, consider 4-6 tall glasses of water a day.   Take plenty of fiber.  Fiber is the undigested part of plant food that passes into the colon, acting s "natures broom" to encourage bowel motility and movement.  Fiber can absorb and hold large amounts of water. This results in a larger, bulkier stool, which is soft and easier to pass. Work gradually over several weeks up to 6 servings a day of fiber (25g a day even more if needed) in the form of: Vegetables -- Root (potatoes, carrots, turnips), leafy green (lettuce, salad greens, celery, spinach), or cooked high residue (cabbage, broccoli, etc) Fruit -- Fresh (unpeeled skin & pulp), Dried (prunes, apricots, cherries, etc ),  or stewed ( applesauce)  Whole grain breads, pasta, etc (whole wheat)  Bran cereals  Bulking Agents -- This type of water-retaining fiber generally is easily obtained each day by one of the following:  Psyllium bran -- The  psyllium plant is remarkable because its ground seeds can retain so much water. This product is available as Metamucil, Konsyl, Effersyllium, Per Diem Fiber, or the less expensive generic preparation in drug and health food stores. Although labeled a laxative, it really is not a laxative.  Methylcellulose -- This is another fiber derived from wood which also retains water. It is available as Citrucel. Polyethylene Glycol - and "artificial" fiber commonly called Miralax or Glycolax.  It is helpful for people with gassy or bloated feelings with regular fiber Flax Seed - a less gassy fiber than psyllium No reading or other relaxing activity while on the toilet. If bowel movements take longer than 5 minutes, you are too constipated AVOID CONSTIPATION.  High fiber and water intake usually takes care of this.  Sometimes a laxative is needed to stimulate more frequent bowel movements, but  Laxatives are not a good long-term solution as it can wear the colon out.  They can help jump-start bowels if constipated, but should be relied on constantly without discussing with your doctor Osmotics (Milk of Magnesia, Fleets phosphosoda, Magnesium citrate, MiraLax, GoLytely) are safer than  Stimulants (Senokot, Castor Oil, Dulcolax, Ex Lax)    Avoid taking laxatives for more than 7 days in a row.  IF SEVERELY CONSTIPATED, try a Bowel Retraining Program: Do not use laxatives.  Eat a diet high in roughage, such as bran cereals and leafy vegetables.  Drink six (6) ounces of prune or apricot juice each morning.  Eat two (2) large servings of stewed fruit each day.  Take one (1) heaping tablespoon of a psyllium-based bulking agent twice a day. Use sugar-free sweetener when possible to avoid excessive calories.  Eat a normal breakfast.  Set aside 15 minutes after breakfast to sit on the toilet, but do not strain to have a bowel movement.  If you do not have a bowel movement by the third day, use an enema and repeat the  above steps.   CONTROLLING DIARRHEA  TAKE A FIBER SUPPLEMENT (FiberCon or Benefiner soluble fiber) twice a day - to thicken stools by absorbing excess fluid and retrain the intestines to act more normally.  Slowly increase the dose over a few weeks.  Too much fiber too soon can backfire and cause cramping & bloating.  TAKE AN IRON SUPPLEMENT twice a day to naturally constipate your bowels.  Usually ferrous sulfate 325mg  twice a day)  TAKE ANTI-DIARRHEAL MEDICINES: Loperamide (Imodium) can slow down diarrhea.  Start with two tablets (= 4mg ) first and then try one tablet every 6 hours.  Can go up to 2 pills four times day (8 pills of 2mg  max) Avoid if you are having fevers or severe pain.  If you are not better or start feeling worse, stop all medicines and call your doctor for advice LoMotil (Diphenoxylate / Atropine) is another medicine that can constipate & slow down bowel moevements Pepto Bismol (bismuth) can gently thicken bowels as well  If diarrhea is worse,: drink plenty of liquids and try simpler foods for a few days to avoid stressing your intestines further. Avoid dairy products (especially milk & ice cream) for a short time.  The intestines often can lose the ability to digest lactose when stressed. Avoid foods that cause gassiness or bloating.  Typical foods include beans and other legumes, cabbage, broccoli, and dairy foods.  Every person has some sensitivity to other foods, so listen to our body and avoid those foods that trigger problems for you.Call your doctor if you are getting worse or not better.  Sometimes further testing (cultures, endoscopy, X-ray studies, bloodwork, etc) may be needed to help diagnose and treat the cause of the diarrhea. Take extra anti-diarrheal medicines (maximum is 8 pills of 2mg  loperamide a day)  TROUBLESHOOTING IRREGULAR BOWELS 1) Avoid extremes of bowel movements (no bad constipation/diarrhea) 2) Miralax 17gm mixed in 8oz. water or juice-daily. May  use BID as needed.  3) Gas-x,Phazyme, etc. as needed for gas & bloating.  4) Soft,bland diet. No spicy,greasy,fried foods.  5) Prilosec over-the-counter as needed  6) May hold gluten/wheat products from diet to see if symptoms improve.  7)  May try probiotics (Align, Activa, etc) to help calm the bowels down 7) If symptoms become worse call back immediately.
